# Patient Record
Sex: Female | Born: 1985 | Hispanic: No | Marital: Married | Smoking: Never smoker
Health system: Southern US, Community
[De-identification: ages and names within clinical notes are randomized; demographics above are authoritative.]

## PROBLEM LIST (undated history)

## (undated) DIAGNOSIS — O24419 Gestational diabetes mellitus in pregnancy, unspecified control: Secondary | ICD-10-CM

## (undated) HISTORY — PX: LAPAROSCOPY: SHX197

## (undated) HISTORY — DX: Gestational diabetes mellitus in pregnancy, unspecified control: O24.419

## (undated) HISTORY — PX: MOUTH SURGERY: SHX715

---

## 2014-02-10 NOTE — L&D Delivery Note (Cosign Needed)
Delivery Note At 8:29 PM a viable female was delivered via Vaginal, Spontaneous Delivery (Presentation: ; Occiput Anterior).  APGAR: 8, 9; weight 7 lb 4.6 oz (3305 g).  Infant dried and placed on pt's abd.  Placenta status: Intact, Spontaneous.  Cord: 3 vessels   Anesthesia: Epidural  Episiotomy: None Lacerations: 2nd degree;Perineal Suture Repair: 3.0 vicryl Est. Blood Loss (mL): 400  Mom to postpartum.  Baby to Couplet care / Skin to Skin.   Cam Hai CNM 10/11/2014, 9:33 PM

## 2014-08-23 ENCOUNTER — Ambulatory Visit (INDEPENDENT_AMBULATORY_CARE_PROVIDER_SITE_OTHER): Payer: Self-pay | Admitting: Advanced Practice Midwife

## 2014-08-23 ENCOUNTER — Encounter: Payer: Self-pay | Admitting: Advanced Practice Midwife

## 2014-08-23 VITALS — BP 119/78 | HR 85 | Temp 98.4°F | Ht 63.78 in | Wt 175.6 lb

## 2014-08-23 DIAGNOSIS — Z3483 Encounter for supervision of other normal pregnancy, third trimester: Secondary | ICD-10-CM

## 2014-08-23 DIAGNOSIS — Z118 Encounter for screening for other infectious and parasitic diseases: Secondary | ICD-10-CM

## 2014-08-23 DIAGNOSIS — Z113 Encounter for screening for infections with a predominantly sexual mode of transmission: Secondary | ICD-10-CM

## 2014-08-23 DIAGNOSIS — Z23 Encounter for immunization: Secondary | ICD-10-CM

## 2014-08-23 DIAGNOSIS — O0933 Supervision of pregnancy with insufficient antenatal care, third trimester: Secondary | ICD-10-CM | POA: Insufficient documentation

## 2014-08-23 DIAGNOSIS — O0990 Supervision of high risk pregnancy, unspecified, unspecified trimester: Secondary | ICD-10-CM | POA: Insufficient documentation

## 2014-08-23 DIAGNOSIS — Z3493 Encounter for supervision of normal pregnancy, unspecified, third trimester: Secondary | ICD-10-CM

## 2014-08-23 DIAGNOSIS — Z3201 Encounter for pregnancy test, result positive: Secondary | ICD-10-CM

## 2014-08-23 DIAGNOSIS — Z124 Encounter for screening for malignant neoplasm of cervix: Secondary | ICD-10-CM

## 2014-08-23 LAB — POCT URINALYSIS DIP (DEVICE)
Bilirubin Urine: NEGATIVE
Glucose, UA: NEGATIVE mg/dL
HGB URINE DIPSTICK: NEGATIVE
KETONES UR: NEGATIVE mg/dL
Leukocytes, UA: NEGATIVE
Nitrite: NEGATIVE
PH: 6.5 (ref 5.0–8.0)
Protein, ur: NEGATIVE mg/dL
Specific Gravity, Urine: 1.01 (ref 1.005–1.030)
UROBILINOGEN UA: 0.2 mg/dL (ref 0.0–1.0)

## 2014-08-23 LAB — POCT PREGNANCY, URINE: Preg Test, Ur: POSITIVE — AB

## 2014-08-23 MED ORDER — ASPIRIN 81 MG PO TABS: 81.0000 mg | ORAL_TABLET | Freq: Every day | ORAL | Status: DC

## 2014-08-23 MED ORDER — TETANUS-DIPHTH-ACELL PERTUSSIS 5-2.5-18.5 LF-MCG/0.5 IM SUSP
0.5000 mL | Freq: Once | INTRAMUSCULAR | Status: AC
  Administered 2014-08-23: 0.5 mL via INTRAMUSCULAR

## 2014-08-23 MED ORDER — CONCEPT OB 130-92.4-1 MG PO CAPS: 1.0000 | ORAL_CAPSULE | Freq: Every day | ORAL | Status: DC

## 2014-08-23 NOTE — Progress Notes (Signed)
Reports pain in both legs similar to when on period  Discussed with patient that this is a teaching facility and that during their pregnancy there may be female physicians and other healthcare providers involved in their care. This includes, but is not limited to, prenatal visits and ultrasound examinations, as well as, the labor and delivery process and postpartum care. Also discussed with patient that they do have the right to transfer their care to another practice in the event that they do not agree or wish to see female providers under any situation. Informed patient that we will make every attempt to have a female provider care for them, though this cannot be guaranteed, such as in an emergent situation. Also, reminded patient that when they are scheduling their appointments, to request a female provider each time and we will try to accommodate their request.

## 2014-08-23 NOTE — Patient Instructions (Addendum)
Monilial Vaginitis  You can take Monistat 7 from the pharmacy  Vaginitis in a soreness, swelling and redness (inflammation) of the vagina and vulva. Monilial vaginitis is not a sexually transmitted infection. CAUSES  Yeast vaginitis is caused by yeast (candida) that is normally found in your vagina. With a yeast infection, the candida has overgrown in number to a point that upsets the chemical balance. SYMPTOMS   White, thick vaginal discharge.  Swelling, itching, redness and irritation of the vagina and possibly the lips of the vagina (vulva).  Burning or painful urination.  Painful intercourse. DIAGNOSIS  Things that may contribute to monilial vaginitis are:  Postmenopausal and virginal states.  Pregnancy.  Infections.  Being tired, sick or stressed, especially if you had monilial vaginitis in the past.  Diabetes. Good control will help lower the chance.  Birth control pills.  Tight fitting garments.  Using bubble bath, feminine sprays, douches or deodorant tampons.  Taking certain medications that kill germs (antibiotics).  Sporadic recurrence can occur if you become ill. TREATMENT  Your caregiver will give you medication.  There are several kinds of anti monilial vaginal creams and suppositories specific for monilial vaginitis. For recurrent yeast infections, use a suppository or cream in the vagina 2 times a week, or as directed.  Anti-monilial or steroid cream for the itching or irritation of the vulva may also be used. Get your caregiver's permission.  Painting the vagina with methylene blue solution may help if the monilial cream does not work.  Eating yogurt may help prevent monilial vaginitis. HOME CARE INSTRUCTIONS   Finish all medication as prescribed.  Do not have sex until treatment is completed or after your caregiver tells you it is okay.  Take warm sitz baths.  Do not douche.  Do not use tampons, especially scented ones.  Wear cotton  underwear.  Avoid tight pants and panty hose.  Tell your sexual partner that you have a yeast infection. They should go to their caregiver if they have symptoms such as mild rash or itching.  Your sexual partner should be treated as well if your infection is difficult to eliminate.  Practice safer sex. Use condoms.  Some vaginal medications cause latex condoms to fail. Vaginal medications that harm condoms are:  Cleocin cream.  Butoconazole (Femstat).  Terconazole (Terazol) vaginal suppository.  Miconazole (Monistat) (may be purchased over the counter). SEEK MEDICAL CARE IF:   You have a temperature by mouth above 102 F (38.9 C).  The infection is getting worse after 2 days of treatment.  The infection is not getting better after 3 days of treatment.  You develop blisters in or around your vagina.  You develop vaginal bleeding, and it is not your menstrual period.  You have pain when you urinate.  You develop intestinal problems.  You have pain with sexual intercourse. Document Released: 11/06/2004 Document Revised: 04/21/2011 Document Reviewed: 07/21/2008 [facility] Patient Information 2015 [place], [place]. This information is not intended to replace advice given to you by your health care provider. Make sure you discuss any questions you have with your health care provider.   Breastfeeding Deciding to breastfeed is one of the best choices you can make for you and your baby. A change in hormones during pregnancy causes your breast tissue to grow and increases the number and size of your milk ducts. These hormones also allow proteins, sugars, and fats from your blood supply to make breast milk in your milk-producing glands. Hormones prevent breast milk from being released  before your baby is born as well as prompt milk flow after birth. Once breastfeeding has begun, thoughts of your baby, as well as his or her sucking or crying, can stimulate the release of milk from your  milk-producing glands.  BENEFITS OF BREASTFEEDING For Your Baby  Your first milk (colostrum) helps your baby's digestive system function better.   There are antibodies in your milk that help your baby fight off infections.   Your baby has a lower incidence of asthma, allergies, and sudden infant death syndrome.   The nutrients in breast milk are better for your baby than infant formulas and are designed uniquely for your baby's needs.   Breast milk improves your baby's brain development.   Your baby is less likely to develop other conditions, such as childhood obesity, asthma, or type 2 diabetes mellitus.  For You   Breastfeeding helps to create a very special bond between you and your baby.   Breastfeeding is convenient. Breast milk is always available at the correct temperature and costs nothing.   Breastfeeding helps to burn calories and helps you lose the weight gained during pregnancy.   Breastfeeding makes your uterus contract to its prepregnancy size faster and slows bleeding (lochia) after you give birth.   Breastfeeding helps to lower your risk of developing type 2 diabetes mellitus, osteoporosis, and breast or ovarian cancer later in life. SIGNS THAT YOUR BABY IS HUNGRY Early Signs of Hunger  Increased alertness or activity.  Stretching.  Movement of the head from side to side.  Movement of the head and opening of the mouth when the corner of the mouth or cheek is stroked (rooting).  Increased sucking sounds, smacking lips, cooing, sighing, or squeaking.  Hand-to-mouth movements.  Increased sucking of fingers or hands. Late Signs of Hunger  Fussing.  Intermittent crying. Extreme Signs of Hunger Signs of extreme hunger will require calming and consoling before your baby will be able to breastfeed successfully. Do not wait for the following signs of extreme hunger to occur before you initiate breastfeeding:   Restlessness.  A loud, strong  cry.   Screaming. BREASTFEEDING BASICS Breastfeeding Initiation  Find a comfortable place to sit or lie down, with your neck and back well supported.  Place a pillow or rolled up blanket under your baby to bring him or her to the level of your breast (if you are seated). Nursing pillows are specially designed to help support your arms and your baby while you breastfeed.  Make sure that your baby's abdomen is facing your abdomen.   Gently massage your breast. With your fingertips, massage from your chest wall toward your nipple in a circular motion. This encourages milk flow. You may need to continue this action during the feeding if your milk flows slowly.  Support your breast with 4 fingers underneath and your thumb above your nipple. Make sure your fingers are well away from your nipple and your baby's mouth.   Stroke your baby's lips gently with your finger or nipple.   When your baby's mouth is open wide enough, quickly bring your baby to your breast, placing your entire nipple and as much of the colored area around your nipple (areola) as possible into your baby's mouth.   More areola should be visible above your baby's upper lip than below the lower lip.   Your baby's tongue should be between his or her lower gum and your breast.   Ensure that your baby's mouth is correctly positioned around  your nipple (latched). Your baby's lips should create a seal on your breast and be turned out (everted).  It is common for your baby to suck about 2-3 minutes in order to start the flow of breast milk. Latching Teaching your baby how to latch on to your breast properly is very important. An improper latch can cause nipple pain and decreased milk supply for you and poor weight gain in your baby. Also, if your baby is not latched onto your nipple properly, he or she may swallow some air during feeding. This can make your baby fussy. Burping your baby when you switch breasts during the  feeding can help to get rid of the air. However, teaching your baby to latch on properly is still the best way to prevent fussiness from swallowing air while breastfeeding. Signs that your baby has successfully latched on to your nipple:    Silent tugging or silent sucking, without causing you pain.   Swallowing heard between every 3-4 sucks.    Muscle movement above and in front of his or her ears while sucking.  Signs that your baby has not successfully latched on to nipple:   Sucking sounds or smacking sounds from your baby while breastfeeding.  Nipple pain. If you think your baby has not latched on correctly, slip your finger into the corner of your baby's mouth to break the suction and place it between your baby's gums. Attempt breastfeeding initiation again. Signs of Successful Breastfeeding Signs from your baby:   A gradual decrease in the number of sucks or complete cessation of sucking.   Falling asleep.   Relaxation of his or her body.   Retention of a small amount of milk in his or her mouth.   Letting go of your breast by himself or herself. Signs from you:  Breasts that have increased in firmness, weight, and size 1-3 hours after feeding.   Breasts that are softer immediately after breastfeeding.  Increased milk volume, as well as a change in milk consistency and color by the fifth day of breastfeeding.   Nipples that are not sore, cracked, or bleeding. Signs That Your Pecola Leisure is Getting Enough Milk  Wetting at least 3 diapers in a 24-hour period. The urine should be clear and pale yellow by age 264 days.  At least 3 stools in a 24-hour period by age 264 days. The stool should be soft and yellow.  At least 3 stools in a 24-hour period by age 37 days. The stool should be seedy and yellow.  No loss of weight greater than 10% of birth weight during the first 78 days of age.  Average weight gain of 4-7 ounces (113-198 g) per week after age 792 days.  Consistent  daily weight gain by age 264 days, without weight loss after the age of 2 weeks. After a feeding, your baby may spit up a small amount. This is common. BREASTFEEDING FREQUENCY AND DURATION Frequent feeding will help you make more milk and can prevent sore nipples and breast engorgement. Breastfeed when you feel the need to reduce the fullness of your breasts or when your baby shows signs of hunger. This is called "breastfeeding on demand." Avoid introducing a pacifier to your baby while you are working to establish breastfeeding (the first 4-6 weeks after your baby is born). After this time you may choose to use a pacifier. Research has shown that pacifier use during the first year of a baby's life decreases the risk of sudden  infant death syndrome (SIDS). Allow your baby to feed on each breast as long as he or she wants. Breastfeed until your baby is finished feeding. When your baby unlatches or falls asleep while feeding from the first breast, offer the second breast. Because newborns are often sleepy in the first few weeks of life, you may need to awaken your baby to get him or her to feed. Breastfeeding times will vary from baby to baby. However, the following rules can serve as a guide to help you ensure that your baby is properly fed:  Newborns (babies 63 weeks of age or younger) may breastfeed every 1-3 hours.  Newborns should not go longer than 3 hours during the day or 5 hours during the night without breastfeeding.  You should breastfeed your baby a minimum of 8 times in a 24-hour period until you begin to introduce solid foods to your baby at around 67 months of age. BREAST MILK PUMPING Pumping and storing breast milk allows you to ensure that your baby is exclusively fed your breast milk, even at times when you are unable to breastfeed. This is especially important if you are going back to work while you are still breastfeeding or when you are not able to be present during feedings. Your  lactation consultant can give you guidelines on how long it is safe to store breast milk.  A breast pump is a machine that allows you to pump milk from your breast into a sterile bottle. The pumped breast milk can then be stored in a refrigerator or freezer. Some breast pumps are operated by hand, while others use electricity. Ask your lactation consultant which type will work best for you. Breast pumps can be purchased, but some hospitals and breastfeeding support groups lease breast pumps on a monthly basis. A lactation consultant can teach you how to hand express breast milk, if you prefer not to use a pump.  CARING FOR YOUR BREASTS WHILE YOU BREASTFEED Nipples can become dry, cracked, and sore while breastfeeding. The following recommendations can help keep your breasts moisturized and healthy:  Avoid using soap on your nipples.   Wear a supportive bra. Although not required, special nursing bras and tank tops are designed to allow access to your breasts for breastfeeding without taking off your entire bra or top. Avoid wearing underwire-style bras or extremely tight bras.  Air dry your nipples for 3-23minutes after each feeding.   Use only cotton bra pads to absorb leaked breast milk. Leaking of breast milk between feedings is normal.   Use lanolin on your nipples after breastfeeding. Lanolin helps to maintain your skin's normal moisture barrier. If you use pure lanolin, you do not need to wash it off before feeding your baby again. Pure lanolin is not toxic to your baby. You may also hand express a few drops of breast milk and gently massage that milk into your nipples and allow the milk to air dry. In the first few weeks after giving birth, some women experience extremely full breasts (engorgement). Engorgement can make your breasts feel heavy, warm, and tender to the touch. Engorgement peaks within 3-5 days after you give birth. The following recommendations can help ease  engorgement:  Completely empty your breasts while breastfeeding or pumping. You may want to start by applying warm, moist heat (in the shower or with warm water-soaked hand towels) just before feeding or pumping. This increases circulation and helps the milk flow. If your baby does not completely empty your  breasts while breastfeeding, pump any extra milk after he or she is finished.  Wear a snug bra (nursing or regular) or tank top for 1-2 days to signal your body to slightly decrease milk production.  Apply ice packs to your breasts, unless this is too uncomfortable for you.  Make sure that your baby is latched on and positioned properly while breastfeeding. If engorgement persists after 48 hours of following these recommendations, contact your health care provider or a Advertising copywriter. OVERALL HEALTH CARE RECOMMENDATIONS WHILE BREASTFEEDING  Eat healthy foods. Alternate between meals and snacks, eating 3 of each per day. Because what you eat affects your breast milk, some of the foods may make your baby more irritable than usual. Avoid eating these foods if you are sure that they are negatively affecting your baby.  Drink milk, fruit juice, and water to satisfy your thirst (about 10 glasses a day).   Rest often, relax, and continue to take your prenatal vitamins to prevent fatigue, stress, and anemia.  Continue breast self-awareness checks.  Avoid chewing and smoking tobacco.  Avoid alcohol and drug use. Some medicines that may be harmful to your baby can pass through breast milk. It is important to ask your health care provider before taking any medicine, including all over-the-counter and prescription medicine as well as vitamin and herbal supplements. It is possible to become pregnant while breastfeeding. If birth control is desired, ask your health care provider about options that will be safe for your baby. SEEK MEDICAL CARE IF:   You feel like you want to stop breastfeeding  or have become frustrated with breastfeeding.  You have painful breasts or nipples.  Your nipples are cracked or bleeding.  Your breasts are red, tender, or warm.  You have a swollen area on either breast.  You have a fever or chills.  You have nausea or vomiting.  You have drainage other than breast milk from your nipples.  Your breasts do not become full before feedings by the fifth day after you give birth.  You feel sad and depressed.  Your baby is too sleepy to eat well.  Your baby is having trouble sleeping.   Your baby is wetting less than 3 diapers in a 24-hour period.  Your baby has less than 3 stools in a 24-hour period.  Your baby's skin or the white part of his or her eyes becomes yellow.   Your baby is not gaining weight by 52 days of age. SEEK IMMEDIATE MEDICAL CARE IF:   Your baby is overly tired (lethargic) and does not want to wake up and feed.  Your baby develops an unexplained fever. Document Released: 01/27/2005 Document Revised: 02/01/2013 Document Reviewed: 07/21/2012 [facility] Patient Information 2015 [place], [place]. This information is not intended to replace advice given to you by your health care provider. Make sure you discuss any questions you have with your health care provider.  Preterm Labor Information Preterm labor is when labor starts at less than 37 weeks of pregnancy. The normal length of a pregnancy is 39 to 41 weeks. CAUSES Often, there is no identifiable underlying cause as to why a woman goes into preterm labor. One of the most common known causes of preterm labor is infection. Infections of the uterus, cervix, vagina, amniotic sac, bladder, kidney, or even the lungs (pneumonia) can cause labor to start. Other suspected causes of preterm labor include:   Urogenital infections, such as yeast infections and bacterial vaginosis.   Uterine abnormalities (uterine shape,  uterine septum, fibroids, or bleeding from the placenta).   A  cervix that has been operated on (it may fail to stay closed).   Malformations in the fetus.   Multiple gestations (twins, triplets, and so on).   Breakage of the amniotic sac.  RISK FACTORS  Having a previous history of preterm labor.   Having premature rupture of membranes (PROM).   Having a placenta that covers the opening of the cervix (placenta previa).   Having a placenta that separates from the uterus (placental abruption).   Having a cervix that is too weak to hold the fetus in the uterus (incompetent cervix).   Having too much fluid in the amniotic sac (polyhydramnios).   Taking illegal drugs or smoking while pregnant.   Not gaining enough weight while pregnant.   Being younger than 84 and older than 27 years old.   Having a low socioeconomic status.   Being African American. SYMPTOMS Signs and symptoms of preterm labor include:   Menstrual-like cramps, abdominal pain, or back pain.  Uterine contractions that are regular, as frequent as six in an hour, regardless of their intensity (may be mild or painful).  Contractions that start on the top of the uterus and spread down to the lower abdomen and back.   A sense of increased pelvic pressure.   A watery or bloody mucus discharge that comes from the vagina.  TREATMENT Depending on the length of the pregnancy and other circumstances, your health care provider may suggest bed rest. If necessary, there are medicines that can be given to stop contractions and to mature the fetal lungs. If labor happens before 34 weeks of pregnancy, a prolonged hospital stay may be recommended. Treatment depends on the condition of both you and the fetus.  WHAT SHOULD YOU DO IF YOU THINK YOU ARE IN PRETERM LABOR? Call your health care provider right away. You will need to go to the hospital to get checked immediately. HOW CAN YOU PREVENT PRETERM LABOR IN FUTURE PREGNANCIES? You should:   Stop smoking if you  smoke.  Maintain healthy weight gain and avoid chemicals and drugs that are not necessary.  Be watchful for any type of infection.  Inform your health care provider if you have a known history of preterm labor. Document Released: 04/19/2003 Document Revised: 09/29/2012 Document Reviewed: 03/01/2012 [facility] Patient Information 2015 [place], [place]. This information is not intended to replace advice given to you by your health care provider. Make sure you discuss any questions you have with your health care provider.

## 2014-08-23 NOTE — Progress Notes (Signed)
Subjective:    Brandy Burnett is a G2P0010 [redacted]w[redacted]d by LMP being seen today for her first obstetrical visit. Received prenatal care before moving to [place]. Her obstetrical history is significant for Nulliparity.  Patient does intend to breast feed. Pregnancy history fully reviewed. Records not available from previous provider and pt cannot get them, but she and husband are excellent historians. Normal pregnancy. Dated by LMP/early [place]. Normal anatomy scan two weeks ago. Does not think she had Pap.  Patient reports backache, vulvar burning.  BP 119/78 mmHg  Pulse 85  Temp(Src) 98.4 F (36.9 C)  Ht 5' 3.78" (1.62 m)  Wt 175 lb 9.6 oz (79.652 kg)  BMI 30.35 kg/m2  LMP 01/10/2014 (LMP Unknown)  HISTORY: OB History  Gravida Para Term Preterm AB SAB TAB Ectopic Multiple Living     # Outcome Date GA Lbr Len/2nd Weight Sex Delivery Anes PTL Lv  2 Current           1 SAB              History reviewed. No pertinent past medical history. Past Surgical History  Procedure Laterality Date  . Laparoscopy      bladder   Family History  Problem Relation Age of Onset  . Cancer Father   . Hypertension Father      Exam    Uterus:   Fundal height 33 cm  Pelvic Exam:    Perineum: No Hemorrhoids, Normal Perineum   Vulva: normal, Bartholin's, Urethra, Skene's normal   Vagina:  curdlike discharge, vaginal mucosa erythematous     pH: NA   Cervix: no bleeding following Pap, no cervical motion tenderness and nulliparous appearance   Adnexa: normal adnexa and no mass, fullness, tenderness   Bony Pelvis: average  System: Breast:  deferred   Skin: normal coloration and turgor, no rashes    Neurologic: oriented, normal, gait normal; reflexes normal and symmetric, grossly non-focal   Extremities: normal strength, tone, and muscle mass, edema   HEENT sclera clear, anicteric, neck supple with midline trachea and thyroid without masses   Mouth/Teeth mucous membranes moist, pharynx  normal without lesions and dental hygiene good   Neck supple and no masses   Cardiovascular: regular rate and rhythm, no murmurs or gallops   Respiratory:  appears well, vitals normal, no respiratory distress, acyanotic, normal RR, chest clear, no wheezing, crepitations, rhonchi, normal symmetric air entry   Abdomen: soft, non-tender; bowel sounds normal; no masses,  no organomegaly   Urinary: urethral meatus normal      Assessment:    Pregnancy: G2P0010 Patient Active Problem List   Diagnosis Date Noted  . Supervision of normal pregnancy in third trimester 08/23/2014  . Limited prenatal care in third trimester 08/23/2014  1. Late prenatal care affecting pregnancy in third trimester  - Prenatal Profile - Culture, OB Urine - Prescript Monitor Profile(19) - Cytology - PAP - US OB Comp + 14 Wk; Future - Tdap (BOOSTRIX) injection 0.5 mL; Inject 0.5 mLs into the muscle once. - Glucose Tolerance, 1 HR (50g) w/o Fasting - Prenat w/o A Vit-FeFum-FePo-FA (CONCEPT OB) 130-92.4-1 MG CAPS; Take 1 tablet by mouth daily. (Patient not taking: Reported on 08/29/2014)  Dispense: 30 capsule; Refill: 12  2. Supervision of normal pregnancy in third trimester  - Glucose Tolerance, 1 HR (50g) w/o Fasting - Prenat w/o A Vit-FeFum-FePo-FA (CONCEPT OB) 130-92.4-1 MG CAPS; Take 1 tablet by mouth daily. (Patient  not taking: Reported on 08/29/2014)  Dispense: 30 capsule; Refill: 12 - aspirin 81 MG tablet; Take 1 tablet (81 mg total) by mouth daily.  Dispense: 30 tablet; Refill: 3  3. Limited prenatal care in third trimester  - Glucose Tolerance, 1 HR (50g) w/o Fasting   Plan:   Initial labs, GTT drawn. Prenatal vitamins. Problem list reviewed and updated. Genetic Screening discussed Quad Screen: too late. Ultrasound discussed; fetal survey: Normal per pt. Declined for now. Discussed that US may be needed if medical indication arises.  Follow up in 2 weeks.  Dorathy KinsmanSMITH, [place]ah Reyburn 08/23/2014

## 2014-08-24 LAB — PRENATAL PROFILE (SOLSTAS)
ANTIBODY SCREEN: NEGATIVE
Basophils Absolute: 0 10*3/uL (ref 0.0–0.1)
Basophils Relative: 0 % (ref 0–1)
Eosinophils Absolute: 0.2 10*3/uL (ref 0.0–0.7)
Eosinophils Relative: 2 % (ref 0–5)
HCT: 33.4 % — ABNORMAL LOW (ref 36.0–46.0)
HIV: NONREACTIVE
Hemoglobin: 11 g/dL — ABNORMAL LOW (ref 12.0–15.0)
Hepatitis B Surface Ag: NEGATIVE
Lymphocytes Relative: 18 % (ref 12–46)
Lymphs Abs: 1.7 10*3/uL (ref 0.7–4.0)
MCH: 26.7 pg (ref 26.0–34.0)
MCHC: 32.9 g/dL (ref 30.0–36.0)
MCV: 81.1 fL (ref 78.0–100.0)
MONO ABS: 0.5 10*3/uL (ref 0.1–1.0)
MONOS PCT: 5 % (ref 3–12)
MPV: 9.5 fL (ref 8.6–12.4)
NEUTROS PCT: 75 % (ref 43–77)
Neutro Abs: 7.3 10*3/uL (ref 1.7–7.7)
PLATELETS: 240 10*3/uL (ref 150–400)
RBC: 4.12 MIL/uL (ref 3.87–5.11)
RDW: 17.4 % — ABNORMAL HIGH (ref 11.5–15.5)
RH TYPE: POSITIVE
RUBELLA: 10.8 {index} — AB (ref <0.90)
WBC: 9.7 10*3/uL (ref 4.0–10.5)

## 2014-08-24 LAB — GLUCOSE TOLERANCE, 1 HOUR (50G) W/O FASTING: Glucose, 1 Hour GTT: 168 mg/dL — ABNORMAL HIGH (ref 70–140)

## 2014-08-24 LAB — CULTURE, OB URINE
Colony Count: NO GROWTH
Organism ID, Bacteria: NO GROWTH

## 2014-08-25 LAB — PRESCRIPTION MONITORING PROFILE (19 PANEL)
Amphetamine/Meth: NEGATIVE ng/mL
BUPRENORPHINE, URINE: NEGATIVE ng/mL
Barbiturate Screen, Urine: NEGATIVE ng/mL
Benzodiazepine Screen, Urine: NEGATIVE ng/mL
CANNABINOID SCRN UR: NEGATIVE ng/mL
CARISOPRODOL, URINE: NEGATIVE ng/mL
COCAINE METABOLITES: NEGATIVE ng/mL
Creatinine, Urine: 40.77 mg/dL (ref >20.0)
Fentanyl, Ur: NEGATIVE ng/mL
MDMA URINE: NEGATIVE ng/mL
Meperidine, Ur: NEGATIVE ng/mL
Methadone Screen, Urine: NEGATIVE ng/mL
Methaqualone: NEGATIVE ng/mL
Nitrites, Initial: NEGATIVE ug/mL
Opiate Screen, Urine: NEGATIVE ng/mL
Oxycodone Screen, Ur: NEGATIVE ng/mL
PH URINE, INITIAL: 6.5 pH (ref 4.5–8.9)
PHENCYCLIDINE, UR: NEGATIVE ng/mL
PROPOXYPHENE: NEGATIVE ng/mL
TRAMADOL UR: NEGATIVE ng/mL
Tapentadol, urine: NEGATIVE ng/mL
ZOLPIDEM, URINE: NEGATIVE ng/mL

## 2014-08-25 LAB — CYTOLOGY - PAP

## 2014-08-29 ENCOUNTER — Ambulatory Visit (INDEPENDENT_AMBULATORY_CARE_PROVIDER_SITE_OTHER): Payer: Self-pay | Admitting: Family Medicine

## 2014-08-29 VITALS — BP 118/75 | HR 96 | Wt 177.7 lb

## 2014-08-29 DIAGNOSIS — R7302 Impaired glucose tolerance (oral): Secondary | ICD-10-CM

## 2014-08-29 DIAGNOSIS — O0993 Supervision of high risk pregnancy, unspecified, third trimester: Secondary | ICD-10-CM

## 2014-08-29 DIAGNOSIS — O0933 Supervision of pregnancy with insufficient antenatal care, third trimester: Secondary | ICD-10-CM

## 2014-08-29 DIAGNOSIS — R7309 Other abnormal glucose: Secondary | ICD-10-CM | POA: Insufficient documentation

## 2014-08-29 LAB — POCT URINALYSIS DIP (DEVICE)
BILIRUBIN URINE: NEGATIVE
GLUCOSE, UA: 250 mg/dL — AB
Hgb urine dipstick: NEGATIVE
Ketones, ur: NEGATIVE mg/dL
Leukocytes, UA: NEGATIVE
Nitrite: NEGATIVE
PH: 5.5 (ref 5.0–8.0)
PROTEIN: NEGATIVE mg/dL
SPECIFIC GRAVITY, URINE: 1.025 (ref 1.005–1.030)
Urobilinogen, UA: 0.2 mg/dL (ref 0.0–1.0)

## 2014-08-29 NOTE — Patient Instructions (Signed)
Gestational Diabetes Mellitus Gestational diabetes mellitus, often simply referred to as gestational diabetes, is a type of diabetes that some women develop during pregnancy. In gestational diabetes, the pancreas does not make enough insulin (a hormone), the cells are less responsive to the insulin that is made (insulin resistance), or both.Normally, insulin moves sugars from food into the tissue cells. The tissue cells use the sugars for energy. The lack of insulin or the lack of normal response to insulin causes excess sugars to build up in the blood instead of going into the tissue cells. As a result, high blood sugar (hyperglycemia) develops. The effect of high sugar (glucose) levels can cause many problems.  RISK FACTORS You have an increased chance of developing gestational diabetes if you have a family history of diabetes and also have one or more of the following risk factors:  A body mass index over 30 (obesity).  A previous pregnancy with gestational diabetes.  An older age at the time of pregnancy. If blood glucose levels are kept in the normal range during pregnancy, women can have a healthy pregnancy. If your blood glucose levels are not well controlled, there may be risks to you, your unborn baby (fetus), your labor and delivery, or your newborn baby.  SYMPTOMS  If symptoms are experienced, they are much like symptoms you would normally expect during pregnancy. The symptoms of gestational diabetes include:   Increased thirst (polydipsia).  Increased urination (polyuria).  Increased urination during the night (nocturia).  Weight loss. This weight loss may be rapid.  Frequent, recurring infections.  Tiredness (fatigue).  Weakness.  Vision changes, such as blurred vision.  Fruity smell to your breath.  Abdominal pain. DIAGNOSIS Diabetes is diagnosed when blood glucose levels are increased. Your blood glucose level may be checked by one or more of the following blood  tests:  A fasting blood glucose test. You will not be allowed to eat for at least 8 hours before a blood sample is taken.  A random blood glucose test. Your blood glucose is checked at any time of the day regardless of when you ate.  A hemoglobin A1c blood glucose test. A hemoglobin A1c test provides information about blood glucose control over the previous 3 months.  An oral glucose tolerance test (OGTT). Your blood glucose is measured after you have not eaten (fasted) for 1-3 hours and then after you drink a glucose-containing beverage. Since the hormones that cause insulin resistance are highest at about 24-28 weeks of a pregnancy, an OGTT is usually performed during that time. If you have risk factors for gestational diabetes, your health care provider may test you for gestational diabetes earlier than 24 weeks of pregnancy. TREATMENT   You will need to take diabetes medicine or insulin daily to keep blood glucose levels in the desired range.  You will need to match insulin dosing with exercise and healthy food choices. The treatment goal is to maintain the before-meal (preprandial), bedtime, and overnight blood glucose level at 60-99 mg/dL during pregnancy. The treatment goal is to further maintain peak after-meal blood sugar (postprandial glucose) level at 100-140 mg/dL. HOME CARE INSTRUCTIONS   Have your hemoglobin A1c level checked twice a year.  Perform daily blood glucose monitoring as directed by your health care provider. It is common to perform frequent blood glucose monitoring.  Monitor urine ketones when you are ill and as directed by your health care provider.  Take your diabetes medicine and insulin as directed by your health care provider   to maintain your blood glucose level in the desired range.  Never run out of diabetes medicine or insulin. It is needed every day.  Adjust insulin based on your intake of carbohydrates. Carbohydrates can raise blood glucose levels but  need to be included in your diet. Carbohydrates provide vitamins, minerals, and fiber which are an essential part of a healthy diet. Carbohydrates are found in fruits, vegetables, whole grains, dairy products, legumes, and foods containing added sugars.  Eat healthy foods. Alternate 3 meals with 3 snacks.  Maintain a healthy weight gain. The usual total expected weight gain varies according to your prepregnancy body mass index (BMI).  Carry a medical alert card or wear your medical alert jewelry.  Carry a 15-gram carbohydrate snack with you at all times to treat low blood glucose (hypoglycemia). Some examples of 15-gram carbohydrate snacks include:  Glucose tablets, 3 or 4.  Glucose gel, 15-gram tube.  Raisins, 2 tablespoons (24 g).  Jelly beans, 6.  Animal crackers, 8.  Fruit juice, regular soda, or low-fat milk, 4 ounces (120 mL).  Gummy treats, 9.  Recognize hypoglycemia. Hypoglycemia during pregnancy occurs with blood glucose levels of 60 mg/dL and below. The risk for hypoglycemia increases when fasting or skipping meals, during or after intense exercise, and during sleep. Hypoglycemia symptoms can include:  Tremors or shakes.  Decreased ability to concentrate.  Sweating.  Increased heart rate.  Headache.  Dry mouth.  Hunger.  Irritability.  Anxiety.  Restless sleep.  Altered speech or coordination.  Confusion.  Treat hypoglycemia promptly. If you are alert and able to safely swallow, follow the 15:15 rule:  Take 15-20 grams of rapid-acting glucose or carbohydrate. Rapid-acting options include glucose gel, glucose tablets, or 4 ounces (120 mL) of fruit juice, regular soda, or low-fat milk.  Check your blood glucose level 15 minutes after taking the glucose.  Take 15-20 grams more of glucose if the repeat blood glucose level is still 70 mg/dL or below.  Eat a meal or snack within 1 hour once blood glucose levels return to normal.  Be alert to polyuria  (excess urination) and polydipsia (excess thirst) which are early signs of hyperglycemia. An early awareness of hyperglycemia allows for prompt treatment. Treat hyperglycemia as directed by your health care provider.  Engage in at least 30 minutes of physical activity a day or as directed by your health care provider. Ten minutes of physical activity timed 30 minutes after each meal is encouraged to control postprandial blood glucose levels.  Adjust your insulin dosing and food intake as needed if you start a new exercise or sport.  Follow your sick-day plan at any time you are unable to eat or drink as usual.  Avoid tobacco and alcohol use.  Keep all follow-up visits as directed by your health care provider.  Follow the advice of your health care provider regarding your prenatal and post-delivery (postpartum) appointments, meal planning, exercise, medicines, vitamins, blood tests, other medical tests, and physical activities.  Perform daily skin and foot care. Examine your skin and feet daily for cuts, bruises, redness, nail problems, bleeding, blisters, or sores.  Brush your teeth and gums at least twice a day and floss at least once a day. Follow up with your dentist regularly.  Schedule an eye exam during the first trimester of your pregnancy or as directed by your health care provider.  Share your diabetes management plan with your workplace or school.  Stay up-to-date with immunizations.  Learn to manage stress.    Obtain ongoing diabetes education and support as needed.  Learn about and consider breastfeeding your baby.  You should have your blood sugar level checked 6-12 weeks after delivery. This is done with an oral glucose tolerance test (OGTT). SEEK MEDICAL CARE IF:   You are unable to eat food or drink fluids for more than 6 hours.  You have nausea and vomiting for more than 6 hours.  You have a blood glucose level of 200 mg/dL and you have ketones in your  urine.  There is a change in mental status.  You develop vision problems.  You have a persistent headache.  You have upper abdominal pain or discomfort.  You develop an additional serious illness.  You have diarrhea for more than 6 hours.  You have been sick or have had a fever for a couple of days and are not getting better. SEEK IMMEDIATE MEDICAL CARE IF:   You have difficulty breathing.  You no longer feel the baby moving.  You are bleeding or have discharge from your vagina.  You start having premature contractions or labor. MAKE SURE YOU:  Understand these instructions.  Will watch your condition.  Will get help right away if you are not doing well or get worse. Document Released: 05/05/2000 Document Revised: 06/13/2013 Document Reviewed: 08/26/2011 ExitCare Patient Information 2015 [employer]. This information is not intended to replace advice given to you by your health care provider. Make sure you discuss any questions you have with your health care provider.  

## 2014-08-29 NOTE — Progress Notes (Signed)
Subjective:  Brandy Burnett is a 29 y.o. G2P0010 at 7015w0d being seen today for ongoing prenatal care.  Patient reports no complaints.  Contractions: Not present.  Vag. Bleeding: None. Movement: Present. Denies leaking of fluid.   Does not want to get US here because she had an ultrasound just prior to leaving.  Will obtain copy of US.  The following portions of the patient's history were reviewed and updated as appropriate: allergies, current medications, past family history, past medical history, past social history, past surgical history and problem list.   Objective:   Filed Vitals:   08/29/14 1448  BP: 118/75  Pulse: 96  Weight: 177 lb 11.2 oz (80.604 kg)    Fetal Status: Fetal Heart Rate (bpm): 150 Fundal Height: 32 cm Movement: Present     General:  Alert, oriented and cooperative. Patient is in no acute distress.  Skin: Skin is warm and dry. No rash noted.   Cardiovascular: Normal heart rate noted  Respiratory: Normal respiratory effort, no problems with respiration noted  Abdomen: Soft, gravid, appropriate for gestational age. Pain/Pressure: Present     Vaginal: Vag. Bleeding: None.       Cervix: Not evaluated        Extremities: Normal range of motion.  Edema: None  Mental Status: Normal mood and affect. Normal behavior. Normal judgment and thought content.   Urinalysis: Urine Protein: Negative Urine Glucose: 2+  Assessment and Plan:  Pregnancy: G2P0010 at 6515w0d  1. Supervision of high-risk pregnancy, third trimester FHT and fundal height normal  2. Elevated glucose tolerance test Schedule 3hr GTT.  3. Insufficient prenatal care in third trimester  Preterm labor symptoms and general obstetric precautions including but not limited to vaginal bleeding, contractions, leaking of fluid and fetal movement were reviewed in detail with the patient. Please refer to After Visit Summary for other counseling recommendations.  No Follow-up on file.   Levie HeritageJacob J Akire Rennert, DO

## 2014-08-31 ENCOUNTER — Encounter: Payer: Self-pay | Admitting: *Deleted

## 2014-08-31 ENCOUNTER — Ambulatory Visit (HOSPITAL_COMMUNITY): Payer: Self-pay | Attending: Advanced Practice Midwife

## 2014-09-01 ENCOUNTER — Other Ambulatory Visit: Payer: Self-pay

## 2014-09-01 DIAGNOSIS — R7309 Other abnormal glucose: Secondary | ICD-10-CM

## 2014-09-02 LAB — GLUCOSE TOLERANCE, 3 HOURS
GLUCOSE, 1 HOUR-GESTATIONAL: 280 mg/dL — AB (ref 70–189)
Glucose Tolerance, 2 hour: 228 mg/dL — ABNORMAL HIGH (ref 70–164)
Glucose Tolerance, Fasting: 103 mg/dL (ref 70–104)
Glucose, GTT - 3 Hour: 177 mg/dL — ABNORMAL HIGH (ref 70–144)

## 2014-09-03 ENCOUNTER — Encounter: Payer: Self-pay | Admitting: Obstetrics and Gynecology

## 2014-09-03 DIAGNOSIS — O24419 Gestational diabetes mellitus in pregnancy, unspecified control: Secondary | ICD-10-CM | POA: Insufficient documentation

## 2014-09-05 ENCOUNTER — Telehealth: Payer: Self-pay | Admitting: General Practice

## 2014-09-05 NOTE — Telephone Encounter (Signed)
Called patient and patient's husband and informed of 3 hr gtt results. Explained at her appt on Monday 8/1 she will see doctor as usual and the diabetes educator, who will explain everything including how to check her blood sugar and dietary recommendations. Patient and husband verbalizes understanding and has no questions

## 2014-09-11 ENCOUNTER — Ambulatory Visit (INDEPENDENT_AMBULATORY_CARE_PROVIDER_SITE_OTHER): Payer: Self-pay | Admitting: Obstetrics & Gynecology

## 2014-09-11 ENCOUNTER — Ambulatory Visit (HOSPITAL_COMMUNITY)
Admission: RE | Admit: 2014-09-11 | Discharge: 2014-09-11 | Disposition: A | Discharge status: Home / Self Care | Payer: Self-pay | Source: Ambulatory Visit | Attending: Obstetrics & Gynecology | Admitting: Obstetrics & Gynecology

## 2014-09-11 ENCOUNTER — Encounter: Payer: Self-pay | Admitting: Obstetrics and Gynecology

## 2014-09-11 ENCOUNTER — Encounter: Payer: Self-pay | Attending: Family Medicine | Admitting: *Deleted

## 2014-09-11 ENCOUNTER — Encounter (HOSPITAL_COMMUNITY): Payer: Self-pay

## 2014-09-11 ENCOUNTER — Encounter: Payer: Self-pay | Admitting: Obstetrics & Gynecology

## 2014-09-11 VITALS — BP 119/82 | HR 88 | Temp 98.3°F | Wt 179.8 lb

## 2014-09-11 DIAGNOSIS — R7309 Other abnormal glucose: Secondary | ICD-10-CM

## 2014-09-11 DIAGNOSIS — Z3A34 34 weeks gestation of pregnancy: Secondary | ICD-10-CM | POA: Insufficient documentation

## 2014-09-11 DIAGNOSIS — O24419 Gestational diabetes mellitus in pregnancy, unspecified control: Secondary | ICD-10-CM | POA: Insufficient documentation

## 2014-09-11 DIAGNOSIS — R7302 Impaired glucose tolerance (oral): Secondary | ICD-10-CM

## 2014-09-11 DIAGNOSIS — O0933 Supervision of pregnancy with insufficient antenatal care, third trimester: Secondary | ICD-10-CM

## 2014-09-11 DIAGNOSIS — O0993 Supervision of high risk pregnancy, unspecified, third trimester: Secondary | ICD-10-CM

## 2014-09-11 DIAGNOSIS — Z713 Dietary counseling and surveillance: Secondary | ICD-10-CM | POA: Insufficient documentation

## 2014-09-11 LAB — POCT URINALYSIS DIP (DEVICE)
BILIRUBIN URINE: NEGATIVE
Glucose, UA: NEGATIVE mg/dL
KETONES UR: NEGATIVE mg/dL
Nitrite: NEGATIVE
PH: 6 (ref 5.0–8.0)
Protein, ur: NEGATIVE mg/dL
SPECIFIC GRAVITY, URINE: 1.01 (ref 1.005–1.030)
Urobilinogen, UA: 0.2 mg/dL (ref 0.0–1.0)

## 2014-09-11 NOTE — Progress Notes (Signed)
Ultrasound scheduled for 09/11/2014 @ 1:15 PM.

## 2014-09-11 NOTE — Progress Notes (Signed)
Subjective:  Brandy Burnett is a 29 y.o. M [place] G2P0010 at [redacted]w[redacted]d being seen today for ongoing prenatal care.  Patient reports no complaints.   .   .  . Denies leaking of fluid.   The following portions of the patient's history were reviewed and updated as appropriate: allergies, current medications, past family history, past medical history, past social history, past surgical history and problem list.   Objective:   Filed Vitals:   09/11/14 1028  BP: 119/82  Pulse: 88  Temp: 98.3 F (36.8 C)  Weight: 179 lb 12.8 oz (81.557 kg)    Fetal Status:           General:  Alert, oriented and cooperative. Patient is in no acute distress.  Skin: Skin is warm and dry. No rash noted.   Cardiovascular: Normal heart rate noted  Respiratory: Normal respiratory effort, no problems with respiration noted  Abdomen: Soft, gravid, appropriate for gestational age.       Vaginal:  .       Cervix: Not evaluated        Extremities: Normal range of motion.     Mental Status: Normal mood and affect. Normal behavior. Normal judgment and thought content.   Urinalysis:      Assessment and Plan:  Pregnancy: G2P0010 at [redacted]w[redacted]d  1. Gestational diabetes mellitus, antepartum  - POCT CBG (Fasting - Glucose) - US OB Detail + 14 Wk; Future - Biophysical profile - She is meeting with the diabetes coordinator today. 2. Elevated glucose tolerance test   3. Supervision of high-risk pregnancy, third trimester   4. Limited prenatal care in third trimester   Preterm labor symptoms and general obstetric precautions including but not limited to vaginal bleeding, contractions, leaking of fluid and fetal movement were reviewed in detail with the patient. Please refer to After Visit Summary for other counseling recommendations.  No Follow-up on file.   Allie Bossier, MD

## 2014-09-11 NOTE — Progress Notes (Signed)
Nutrition Note: Pt. Seen today for GDM management.  Reviewed current diet.  Eating 2 meals and 2 snacks daily.  Drinking water and milk.   Discussed importance of 3 meals and 2-3 snacks/d with carbohydrate and protein pairing for glucose control.  Discussed incorporating exercise as able.  PNV daily. F/U 1-2 weeks. Candice C. Earlene Plater, MPH, RD, LDN

## 2014-09-11 NOTE — Progress Notes (Signed)
  Patient was seen on 09/11/14 for Gestational Diabetes self-management . [employer] Interpre[employer] [place] [facility] assisted. Handout provided in Arabic. Husband appeared to understand most discussion. The following learning objectives were met by the patient :   States the definition of Gestational Diabetes  States when to check blood glucose levels  Demonstrates proper blood glucose monitoring techniques  States the effect of stress and exercise on blood glucose levels  States the importance of limiting caffeine and abstaining from alcohol and smoking  Plan:  Begin reading food labels for Total Carbohydrate and sugar grams of foods Consider  increasing your activity level by walking daily as tolerated Begin checking BG before breakfast and 1-2 hours after first bit of breakfast, lunch and dinner after  as directed by MD  Take medication  as directed by MD  Blood glucose monitor given: [employer] Lot # [id number] Exp: 2016/09/09 Blood glucose reading: $RemoveBeforeDE'110mg'EiHDbRDMEjWcrAH$ /dl following a banana for breakfast  Patient instructed to monitor glucose levels: FBS: 60 - <90 2 hour: <120  Patient received the following handouts:  Nutrition Diabetes and Pregnancy  Carbohydrate Counting List  Meal Planning worksheet  Patient will be seen for follow-up as needed.

## 2014-09-12 ENCOUNTER — Encounter: Payer: Self-pay | Admitting: Obstetrics and Gynecology

## 2014-09-13 ENCOUNTER — Ambulatory Visit (HOSPITAL_COMMUNITY): Payer: Self-pay

## 2014-09-18 ENCOUNTER — Encounter: Payer: Self-pay | Admitting: Obstetrics and Gynecology

## 2014-09-18 ENCOUNTER — Ambulatory Visit (INDEPENDENT_AMBULATORY_CARE_PROVIDER_SITE_OTHER): Payer: Self-pay | Admitting: Obstetrics and Gynecology

## 2014-09-18 VITALS — BP 120/77 | HR 63 | Temp 98.0°F | Wt 181.3 lb

## 2014-09-18 DIAGNOSIS — O24419 Gestational diabetes mellitus in pregnancy, unspecified control: Secondary | ICD-10-CM

## 2014-09-18 DIAGNOSIS — O0990 Supervision of high risk pregnancy, unspecified, unspecified trimester: Secondary | ICD-10-CM

## 2014-09-18 DIAGNOSIS — O0993 Supervision of high risk pregnancy, unspecified, third trimester: Secondary | ICD-10-CM

## 2014-09-18 LAB — POCT URINALYSIS DIP (DEVICE)
Bilirubin Urine: NEGATIVE
GLUCOSE, UA: NEGATIVE mg/dL
HGB URINE DIPSTICK: NEGATIVE
Ketones, ur: NEGATIVE mg/dL
Leukocytes, UA: NEGATIVE
NITRITE: NEGATIVE
PH: 6 (ref 5.0–8.0)
Protein, ur: NEGATIVE mg/dL
SPECIFIC GRAVITY, URINE: 1.01 (ref 1.005–1.030)
UROBILINOGEN UA: 0.2 mg/dL (ref 0.0–1.0)

## 2014-09-18 LAB — OB RESULTS CONSOLE GC/CHLAMYDIA
Chlamydia: NEGATIVE
GC PROBE AMP, GENITAL: NEGATIVE

## 2014-09-18 LAB — OB RESULTS CONSOLE GBS: GBS: NEGATIVE

## 2014-09-18 MED ORDER — GLYBURIDE 2.5 MG PO TABS
1.2500 mg | ORAL_TABLET | Freq: Every day | ORAL | Status: DC
Start: 2014-09-18 — End: 2014-09-18

## 2014-09-18 MED ORDER — PRENATAL VITAMINS 0.8 MG PO TABS: 1.0000 | ORAL_TABLET | Freq: Every day | ORAL | Status: AC

## 2014-09-18 MED ORDER — GLYBURIDE 2.5 MG PO TABS: 1.2500 mg | ORAL_TABLET | Freq: Two times a day (BID) | ORAL | Status: DC

## 2014-09-18 NOTE — Progress Notes (Signed)
Subjective:  Brandy Burnett is a 29 y.o. G2P0010 at [redacted]w[redacted]d being seen today for ongoing prenatal care.  Patient reports no complaints. Brings sugar log. 3 fastings > 95. 10 of 21 2-hour post-prandials greater than 120 Contractions: Not present.  Vag. Bleeding: None. Movement: Present. Denies leaking of fluid.   The following portions of the patient's history were reviewed and updated as appropriate: allergies, current medications, past family history, past medical history, past social history, past surgical history and problem list.   Objective:   Filed Vitals:   09/18/14 0759  BP: 120/77  Pulse: 63  Temp: 98 F (36.7 C)  Weight: 181 lb 4.8 oz (82.237 kg)    Fetal Status: Fetal Heart Rate (bpm): 124   Movement: Present     General:  Alert, oriented and cooperative. Patient is in no acute distress.  Skin: Skin is warm and dry. No rash noted.   Cardiovascular: Normal heart rate noted  Respiratory: Normal respiratory effort, no problems with respiration noted  Abdomen: Soft, gravid, appropriate for gestational age. Pain/Pressure: Present     Pelvic: Vag. Bleeding: None Vag D/C Character: White   Cervical exam deferred        Extremities: Normal range of motion.  Edema: None  Mental Status: Normal mood and affect. Normal behavior. Normal judgment and thought content.   Urinalysis: Urine Protein: Negative Urine Glucose: Negative  Assessment and Plan:  Pregnancy: G2P0010 at [redacted]w[redacted]d  1. Gestational diabetes mellitus, antepartum - sugars above goal, starting glyburide 1.25 mg po bid - starting twice weekly antenatal testing today, NST reactive today - growth u/s scheduled in ~ 2 weeks  2. Late prenatal care - gc/chlamydia/gbs obtained today  Preterm labor symptoms and general obstetric precautions including but not limited to vaginal bleeding, contractions, leaking of fluid and fetal movement were reviewed in detail with the patient. Please refer to After Visit Summary for other  counseling recommendations.  No Follow-up on file.   Kathrynn Running, MD

## 2014-09-18 NOTE — Progress Notes (Signed)
Manhel Ahmed used for interpreter

## 2014-09-19 LAB — GC/CHLAMYDIA PROBE AMP
CT Probe RNA: NEGATIVE
GC Probe RNA: NEGATIVE

## 2014-09-20 ENCOUNTER — Other Ambulatory Visit: Payer: Self-pay | Admitting: Obstetrics & Gynecology

## 2014-09-20 DIAGNOSIS — O0933 Supervision of pregnancy with insufficient antenatal care, third trimester: Secondary | ICD-10-CM

## 2014-09-20 DIAGNOSIS — O24319 Unspecified pre-existing diabetes mellitus in pregnancy, unspecified trimester: Secondary | ICD-10-CM

## 2014-09-20 DIAGNOSIS — Z3A37 37 weeks gestation of pregnancy: Secondary | ICD-10-CM

## 2014-09-20 DIAGNOSIS — R7309 Other abnormal glucose: Secondary | ICD-10-CM

## 2014-09-20 LAB — CULTURE, BETA STREP (GROUP B ONLY)

## 2014-09-22 ENCOUNTER — Ambulatory Visit (INDEPENDENT_AMBULATORY_CARE_PROVIDER_SITE_OTHER): Payer: Self-pay | Admitting: *Deleted

## 2014-09-22 ENCOUNTER — Other Ambulatory Visit: Payer: Self-pay

## 2014-09-22 ENCOUNTER — Encounter: Payer: Self-pay | Admitting: Obstetrics and Gynecology

## 2014-09-22 VITALS — BP 113/70 | HR 105

## 2014-09-22 DIAGNOSIS — O0993 Supervision of high risk pregnancy, unspecified, third trimester: Secondary | ICD-10-CM

## 2014-09-22 DIAGNOSIS — O0933 Supervision of pregnancy with insufficient antenatal care, third trimester: Secondary | ICD-10-CM

## 2014-09-22 DIAGNOSIS — O24419 Gestational diabetes mellitus in pregnancy, unspecified control: Secondary | ICD-10-CM

## 2014-09-22 NOTE — Progress Notes (Signed)
Interpreter present for encounter.  Pt reports that some days she has 6-7 hrs with no FM.  She felt good FM during NST. Kick counts discussed and recommended if she has 4 hrs with no FM. She and husband voiced understanding.

## 2014-09-25 ENCOUNTER — Ambulatory Visit (INDEPENDENT_AMBULATORY_CARE_PROVIDER_SITE_OTHER): Payer: Self-pay | Admitting: Family Medicine

## 2014-09-25 VITALS — BP 127/77 | HR 93 | Temp 98.3°F | Wt 183.3 lb

## 2014-09-25 DIAGNOSIS — O0933 Supervision of pregnancy with insufficient antenatal care, third trimester: Secondary | ICD-10-CM

## 2014-09-25 DIAGNOSIS — O24419 Gestational diabetes mellitus in pregnancy, unspecified control: Secondary | ICD-10-CM

## 2014-09-25 LAB — POCT URINALYSIS DIP (DEVICE)
BILIRUBIN URINE: NEGATIVE
Glucose, UA: NEGATIVE mg/dL
HGB URINE DIPSTICK: NEGATIVE
KETONES UR: NEGATIVE mg/dL
Leukocytes, UA: NEGATIVE
Nitrite: NEGATIVE
PH: 5.5 (ref 5.0–8.0)
PROTEIN: NEGATIVE mg/dL
SPECIFIC GRAVITY, URINE: 1.01 (ref 1.005–1.030)
Urobilinogen, UA: 0.2 mg/dL (ref 0.0–1.0)

## 2014-09-25 MED ORDER — GLYBURIDE 2.5 MG PO TABS: 2.5000 mg | ORAL_TABLET | Freq: Two times a day (BID) | ORAL | Status: DC

## 2014-09-25 NOTE — Progress Notes (Signed)
Subjective:  Brandy Burnett is a 29 y.o. G2P0010 at [redacted]w[redacted]d being seen today for ongoing prenatal care.  Patient reports no complaints.  Contractions: Not present.  Vag. Bleeding: None. Movement: Present. Denies leaking of fluid.   The following portions of the patient's history were reviewed and updated as appropriate: allergies, current medications, past family history, past medical history, past social history, past surgical history and problem list.   Objective:   Filed Vitals:   09/25/14 1034  BP: 127/77  Pulse: 93  Temp: 98.3 F (36.8 C)  Weight: 183 lb 4.8 oz (83.144 kg)    Fetal Status: Fetal Heart Rate (bpm): 159 Fundal Height: 34 cm Movement: Present  Presentation: Vertex  General:  Alert, oriented and cooperative. Patient is in no acute distress.  Skin: Skin is warm and dry. No rash noted.   Cardiovascular: Normal heart rate noted  Respiratory: Normal respiratory effort, no problems with respiration noted  Abdomen: Soft, gravid, appropriate for gestational age. Pain/Pressure: Present     Pelvic: Vag. Bleeding: None     Cervical exam deferred        Extremities: Normal range of motion.  Edema: None  Mental Status: Normal mood and affect. Normal behavior. Normal judgment and thought content.   Urinalysis: Urine Protein: Negative Urine Glucose: Negative  Assessment and Plan:  Pregnancy: G2P0010 at [redacted]w[redacted]d  1. Late prenatal care affecting pregnancy in third trimester, antepartum Continue routine prenatal care.  2. Gestational diabetes mellitus, antepartum Increase glyburide to 2.5 q am 1.25 q hs Continue 2x/wk NST's, with AFI IOL at 39 wks U/s for growth next week. - glyBURIDE (DIABETA) 2.5 MG tablet; Take 1 tablet (2.5 mg total) by mouth 2 (two) times daily with a meal. Take 1 pill in am and 1/2 pill q hs  Dispense: 30 tablet; Refill: 3  Term labor symptoms and general obstetric precautions including but not limited to vaginal bleeding, contractions, leaking of fluid and  fetal movement were reviewed in detail with the patient. Please refer to After Visit Summary for other counseling recommendations.  Return in 1 week (on 10/02/2014) for OB visit and NST, HRC, NST only in 4 days.   Reva Bores, MD

## 2014-09-25 NOTE — Patient Instructions (Signed)
Third Trimester of Pregnancy The third trimester is from week 29 through week 42, months 7 through 9. The third trimester is a time when the fetus is growing rapidly. At the end of the ninth month, the fetus is about 20 inches in length and weighs 6-10 pounds.  BODY CHANGES Your body goes through many changes during pregnancy. The changes vary from woman to woman.   Your weight will continue to increase. You can expect to gain 25-35 pounds (11-16 kg) by the end of the pregnancy.  You may begin to get stretch marks on your hips, abdomen, and breasts.  You may urinate more often because the fetus is moving lower into your pelvis and pressing on your bladder.  You may develop or continue to have heartburn as a result of your pregnancy.  You may develop constipation because certain hormones are causing the muscles that push waste through your intestines to slow down.  You may develop hemorrhoids or swollen, bulging veins (varicose veins).  You may have pelvic pain because of the weight gain and pregnancy hormones relaxing your joints between the bones in your pelvis. Backaches may result from overexertion of the muscles supporting your posture.  You may have changes in your hair. These can include thickening of your hair, rapid growth, and changes in texture. Some women also have hair loss during or after pregnancy, or hair that feels dry or thin. Your hair will most likely return to normal after your baby is born.  Your breasts will continue to grow and be tender. A yellow discharge may leak from your breasts called colostrum.  Your belly button may stick out.  You may feel short of breath because of your expanding uterus.  You may notice the fetus "dropping," or moving lower in your abdomen.  You may have a bloody mucus discharge. This usually occurs a few days to a week before labor begins.  Your cervix becomes thin and soft (effaced) near your due date. WHAT TO EXPECT AT YOUR  PRENATAL EXAMS  You will have prenatal exams every 2 weeks until week 36. Then, you will have weekly prenatal exams. During a routine prenatal visit:  You will be weighed to make sure you and the fetus are growing normally.  Your blood pressure is taken.  Your abdomen will be measured to track your baby's growth.  The fetal heartbeat will be listened to.  Any test results from the previous visit will be discussed.  You may have a cervical check near your due date to see if you have effaced. At around 36 weeks, your caregiver will check your cervix. At the same time, your caregiver will also perform a test on the secretions of the vaginal tissue. This test is to determine if a type of bacteria, Group B streptococcus, is present. Your caregiver will explain this further. Your caregiver may ask you:  What your birth plan is.  How you are feeling.  If you are feeling the baby move.  If you have had any abnormal symptoms, such as leaking fluid, bleeding, severe headaches, or abdominal cramping.  If you have any questions. Other tests or screenings that may be performed during your third trimester include:  Blood tests that check for low iron levels (anemia).  Fetal testing to check the health, activity level, and growth of the fetus. Testing is done if you have certain medical conditions or if there are problems during the pregnancy. FALSE LABOR You may feel small, irregular contractions that   eventually go away. These are called Braxton Hicks contractions, or false labor. Contractions may last for hours, days, or even weeks before true labor sets in. If contractions come at regular intervals, intensify, or become painful, it is best to be seen by your caregiver.  SIGNS OF LABOR   Menstrual-like cramps.  Contractions that are 5 minutes apart or less.  Contractions that start on the top of the uterus and spread down to the lower abdomen and back.  A sense of increased pelvic  pressure or back pain.  A watery or bloody mucus discharge that comes from the vagina. If you have any of these signs before the 37th week of pregnancy, call your caregiver right away. You need to go to the hospital to get checked immediately. HOME CARE INSTRUCTIONS   Avoid all smoking, herbs, alcohol, and unprescribed drugs. These chemicals affect the formation and growth of the baby.  Follow your caregiver's instructions regarding medicine use. There are medicines that are either safe or unsafe to take during pregnancy.  Exercise only as directed by your caregiver. Experiencing uterine cramps is a good sign to stop exercising.  Continue to eat regular, healthy meals.  Wear a good support bra for breast tenderness.  Do not use hot tubs, steam rooms, or saunas.  Wear your seat belt at all times when driving.  Avoid raw meat, uncooked cheese, cat litter boxes, and soil used by cats. These carry germs that can cause birth defects in the baby.  Take your prenatal vitamins.  Try taking a stool softener (if your caregiver approves) if you develop constipation. Eat more high-fiber foods, such as fresh vegetables or fruit and whole grains. Drink plenty of fluids to keep your urine clear or pale yellow.  Take warm sitz baths to soothe any pain or discomfort caused by hemorrhoids. Use hemorrhoid cream if your caregiver approves.  If you develop varicose veins, wear support hose. Elevate your feet for 15 minutes, 3-4 times a day. Limit salt in your diet.  Avoid heavy lifting, wear low heal shoes, and practice good posture.  Rest a lot with your legs elevated if you have leg cramps or low back pain.  Visit your dentist if you have not gone during your pregnancy. Use a soft toothbrush to brush your teeth and be gentle when you floss.  A sexual relationship may be continued unless your caregiver directs you otherwise.  Do not travel far distances unless it is absolutely necessary and only  with the approval of your caregiver.  Take prenatal classes to understand, practice, and ask questions about the labor and delivery.  Make a trial run to the hospital.  Pack your hospital bag.  Prepare the baby's nursery.  Continue to go to all your prenatal visits as directed by your caregiver. SEEK MEDICAL CARE IF:  You are unsure if you are in labor or if your water has broken.  You have dizziness.  You have mild pelvic cramps, pelvic pressure, or nagging pain in your abdominal area.  You have persistent nausea, vomiting, or diarrhea.  You have a bad smelling vaginal discharge.  You have pain with urination. SEEK IMMEDIATE MEDICAL CARE IF:   You have a fever.  You are leaking fluid from your vagina.  You have spotting or bleeding from your vagina.  You have severe abdominal cramping or pain.  You have rapid weight loss or gain.  You have shortness of breath with chest pain.  You notice sudden or extreme swelling   of your face, hands, ankles, feet, or legs.  You have not felt your baby move in over an hour.  You have severe headaches that do not go away with medicine.  You have vision changes. Document Released: 01/21/2001 Document Revised: 02/01/2013 Document Reviewed: 03/30/2012 [employer] Patient Information 2015 [employer]. This information is not intended to replace advice given to you by your health care provider. Make sure you discuss any questions you have with your health care provider.  Breastfeeding Deciding to breastfeed is one of the best choices you can make for you and your baby. A change in hormones during pregnancy causes your breast tissue to grow and increases the number and size of your milk ducts. These hormones also allow proteins, sugars, and fats from your blood supply to make breast milk in your milk-producing glands. Hormones prevent breast milk from being released before your baby is born as well as prompt milk flow after birth. Once  breastfeeding has begun, thoughts of your baby, as well as his or her sucking or crying, can stimulate the release of milk from your milk-producing glands.  BENEFITS OF BREASTFEEDING For Your Baby  Your first milk (colostrum) helps your baby's digestive system function better.   There are antibodies in your milk that help your baby fight off infections.   Your baby has a lower incidence of asthma, allergies, and sudden infant death syndrome.   The nutrients in breast milk are better for your baby than infant formulas and are designed uniquely for your baby's needs.   Breast milk improves your baby's brain development.   Your baby is less likely to develop other conditions, such as childhood obesity, asthma, or type 2 diabetes mellitus.  For You   Breastfeeding helps to create a very special bond between you and your baby.   Breastfeeding is convenient. Breast milk is always available at the correct temperature and costs nothing.   Breastfeeding helps to burn calories and helps you lose the weight gained during pregnancy.   Breastfeeding makes your uterus contract to its prepregnancy size faster and slows bleeding (lochia) after you give birth.   Breastfeeding helps to lower your risk of developing type 2 diabetes mellitus, osteoporosis, and breast or ovarian cancer later in life. SIGNS THAT YOUR BABY IS HUNGRY Early Signs of Hunger  Increased alertness or activity.  Stretching.  Movement of the head from side to side.  Movement of the head and opening of the mouth when the corner of the mouth or cheek is stroked (rooting).  Increased sucking sounds, smacking lips, cooing, sighing, or squeaking.  Hand-to-mouth movements.  Increased sucking of fingers or hands. Late Signs of Hunger  Fussing.  Intermittent crying. Extreme Signs of Hunger Signs of extreme hunger will require calming and consoling before your baby will be able to breastfeed successfully. Do not  wait for the following signs of extreme hunger to occur before you initiate breastfeeding:   Restlessness.  A loud, strong cry.   Screaming. BREASTFEEDING BASICS Breastfeeding Initiation  Find a comfortable place to sit or lie down, with your neck and back well supported.  Place a pillow or rolled up blanket under your baby to bring him or her to the level of your breast (if you are seated). Nursing pillows are specially designed to help support your arms and your baby while you breastfeed.  Make sure that your baby's abdomen is facing your abdomen.   Gently massage your breast. With your fingertips, massage from your chest   wall toward your nipple in a circular motion. This encourages milk flow. You may need to continue this action during the feeding if your milk flows slowly.  Support your breast with 4 fingers underneath and your thumb above your nipple. Make sure your fingers are well away from your nipple and your baby's mouth.   Stroke your baby's lips gently with your finger or nipple.   When your baby's mouth is open wide enough, quickly bring your baby to your breast, placing your entire nipple and as much of the colored area around your nipple (areola) as possible into your baby's mouth.   More areola should be visible above your baby's upper lip than below the lower lip.   Your baby's tongue should be between his or her lower gum and your breast.   Ensure that your baby's mouth is correctly positioned around your nipple (latched). Your baby's lips should create a seal on your breast and be turned out (everted).  It is common for your baby to suck about 2-3 minutes in order to start the flow of breast milk. Latching Teaching your baby how to latch on to your breast properly is very important. An improper latch can cause nipple pain and decreased milk supply for you and poor weight gain in your baby. Also, if your baby is not latched onto your nipple properly, he or she  may swallow some air during feeding. This can make your baby fussy. Burping your baby when you switch breasts during the feeding can help to get rid of the air. However, teaching your baby to latch on properly is still the best way to prevent fussiness from swallowing air while breastfeeding. Signs that your baby has successfully latched on to your nipple:    Silent tugging or silent sucking, without causing you pain.   Swallowing heard between every 3-4 sucks.    Muscle movement above and in front of his or her ears while sucking.  Signs that your baby has not successfully latched on to nipple:   Sucking sounds or smacking sounds from your baby while breastfeeding.  Nipple pain. If you think your baby has not latched on correctly, slip your finger into the corner of your baby's mouth to break the suction and place it between your baby's gums. Attempt breastfeeding initiation again. Signs of Successful Breastfeeding Signs from your baby:   A gradual decrease in the number of sucks or complete cessation of sucking.   Falling asleep.   Relaxation of his or her body.   Retention of a small amount of milk in his or her mouth.   Letting go of your breast by himself or herself. Signs from you:  Breasts that have increased in firmness, weight, and size 1-3 hours after feeding.   Breasts that are softer immediately after breastfeeding.  Increased milk volume, as well as a change in milk consistency and color by the fifth day of breastfeeding.   Nipples that are not sore, cracked, or bleeding. Signs That Your Baby is Getting Enough Milk  Wetting at least 3 diapers in a 24-hour period. The urine should be clear and pale yellow by age 5 days.  At least 3 stools in a 24-hour period by age 5 days. The stool should be soft and yellow.  At least 3 stools in a 24-hour period by age 7 days. The stool should be seedy and yellow.  No loss of weight greater than 10% of birth weight  during the first 3   days of age.  Average weight gain of 4-7 ounces (113-198 g) per week after age 4 days.  Consistent daily weight gain by age 5 days, without weight loss after the age of 2 weeks. After a feeding, your baby may spit up a small amount. This is common. BREASTFEEDING FREQUENCY AND DURATION Frequent feeding will help you make more milk and can prevent sore nipples and breast engorgement. Breastfeed when you feel the need to reduce the fullness of your breasts or when your baby shows signs of hunger. This is called "breastfeeding on demand." Avoid introducing a pacifier to your baby while you are working to establish breastfeeding (the first 4-6 weeks after your baby is born). After this time you may choose to use a pacifier. Research has shown that pacifier use during the first year of a baby's life decreases the risk of sudden infant death syndrome (SIDS). Allow your baby to feed on each breast as long as he or she wants. Breastfeed until your baby is finished feeding. When your baby unlatches or falls asleep while feeding from the first breast, offer the second breast. Because newborns are often sleepy in the first few weeks of life, you may need to awaken your baby to get him or her to feed. Breastfeeding times will vary from baby to baby. However, the following rules can serve as a guide to help you ensure that your baby is properly fed:  Newborns (babies 4 weeks of age or younger) may breastfeed every 1-3 hours.  Newborns should not go longer than 3 hours during the day or 5 hours during the night without breastfeeding.  You should breastfeed your baby a minimum of 8 times in a 24-hour period until you begin to introduce solid foods to your baby at around 6 months of age. BREAST MILK PUMPING Pumping and storing breast milk allows you to ensure that your baby is exclusively fed your breast milk, even at times when you are unable to breastfeed. This is especially important if you are  going back to work while you are still breastfeeding or when you are not able to be present during feedings. Your lactation consultant can give you guidelines on how long it is safe to store breast milk.  A breast pump is a machine that allows you to pump milk from your breast into a sterile bottle. The pumped breast milk can then be stored in a refrigerator or freezer. Some breast pumps are operated by hand, while others use electricity. Ask your lactation consultant which type will work best for you. Breast pumps can be purchased, but some hospitals and breastfeeding support groups lease breast pumps on a monthly basis. A lactation consultant can teach you how to hand express breast milk, if you prefer not to use a pump.  CARING FOR YOUR BREASTS WHILE YOU BREASTFEED Nipples can become dry, cracked, and sore while breastfeeding. The following recommendations can help keep your breasts moisturized and healthy:  Avoid using soap on your nipples.   Wear a supportive bra. Although not required, special nursing bras and tank tops are designed to allow access to your breasts for breastfeeding without taking off your entire bra or top. Avoid wearing underwire-style bras or extremely tight bras.  Air dry your nipples for 3-4minutes after each feeding.   Use only cotton bra pads to absorb leaked breast milk. Leaking of breast milk between feedings is normal.   Use lanolin on your nipples after breastfeeding. Lanolin helps to maintain your skin's   normal moisture barrier. If you use pure lanolin, you do not need to wash it off before feeding your baby again. Pure lanolin is not toxic to your baby. You may also hand express a few drops of breast milk and gently massage that milk into your nipples and allow the milk to air dry. In the first few weeks after giving birth, some women experience extremely full breasts (engorgement). Engorgement can make your breasts feel heavy, warm, and tender to the touch.  Engorgement peaks within 3-5 days after you give birth. The following recommendations can help ease engorgement:  Completely empty your breasts while breastfeeding or pumping. You may want to start by applying warm, moist heat (in the shower or with warm water-soaked hand towels) just before feeding or pumping. This increases circulation and helps the milk flow. If your baby does not completely empty your breasts while breastfeeding, pump any extra milk after he or she is finished.  Wear a snug bra (nursing or regular) or tank top for 1-2 days to signal your body to slightly decrease milk production.  Apply ice packs to your breasts, unless this is too uncomfortable for you.  Make sure that your baby is latched on and positioned properly while breastfeeding. If engorgement persists after 48 hours of following these recommendations, contact your health care provider or a lactation consultant. OVERALL HEALTH CARE RECOMMENDATIONS WHILE BREASTFEEDING  Eat healthy foods. Alternate between meals and snacks, eating 3 of each per day. Because what you eat affects your breast milk, some of the foods may make your baby more irritable than usual. Avoid eating these foods if you are sure that they are negatively affecting your baby.  Drink milk, fruit juice, and water to satisfy your thirst (about 10 glasses a day).   Rest often, relax, and continue to take your prenatal vitamins to prevent fatigue, stress, and anemia.  Continue breast self-awareness checks.  Avoid chewing and smoking tobacco.  Avoid alcohol and drug use. Some medicines that may be harmful to your baby can pass through breast milk. It is important to ask your health care provider before taking any medicine, including all over-the-counter and prescription medicine as well as vitamin and herbal supplements. It is possible to become pregnant while breastfeeding. If birth control is desired, ask your health care provider about options that  will be safe for your baby. SEEK MEDICAL CARE IF:   You feel like you want to stop breastfeeding or have become frustrated with breastfeeding.  You have painful breasts or nipples.  Your nipples are cracked or bleeding.  Your breasts are red, tender, or warm.  You have a swollen area on either breast.  You have a fever or chills.  You have nausea or vomiting.  You have drainage other than breast milk from your nipples.  Your breasts do not become full before feedings by the fifth day after you give birth.  You feel sad and depressed.  Your baby is too sleepy to eat well.  Your baby is having trouble sleeping.   Your baby is wetting less than 3 diapers in a 24-hour period.  Your baby has less than 3 stools in a 24-hour period.  Your baby's skin or the white part of his or her eyes becomes yellow.   Your baby is not gaining weight by 5 days of age. SEEK IMMEDIATE MEDICAL CARE IF:   Your baby is overly tired (lethargic) and does not want to wake up and feed.  Your baby   develops an unexplained fever. Document Released: 01/27/2005 Document Revised: 02/01/2013 Document Reviewed: 07/21/2012 [employer] Patient Information 2015 [employer]. This information is not intended to replace advice given to you by your health care provider. Make sure you discuss any questions you have with your health care provider.  

## 2014-09-25 NOTE — Progress Notes (Signed)
NST reviewed and reactive.  

## 2014-09-25 NOTE — Progress Notes (Signed)
Interpreter Feryal Yousef Pain/pressure- lower abd. Bilateral legs more on right side Pt reports having hemorroids; pt given otc medication list that advises preprepration H and Tucks.

## 2014-09-29 ENCOUNTER — Ambulatory Visit (INDEPENDENT_AMBULATORY_CARE_PROVIDER_SITE_OTHER): Payer: Self-pay | Admitting: *Deleted

## 2014-09-29 VITALS — BP 114/68 | HR 110

## 2014-09-29 DIAGNOSIS — O24419 Gestational diabetes mellitus in pregnancy, unspecified control: Secondary | ICD-10-CM

## 2014-09-29 NOTE — Progress Notes (Signed)
NST reviewed and reactive.  

## 2014-09-29 NOTE — Progress Notes (Signed)
Interpreter present for encounter.  Pt again reporting decreased FM - felt some FM during NST.  Kick counts reveiwed again and emphasized importance of performing at home for her own reassurance as well as to know whether hospital visit is warranted. Husband present for visit. Both pt and husband voice understanding of instructions.

## 2014-10-02 ENCOUNTER — Encounter: Payer: Self-pay | Admitting: Family Medicine

## 2014-10-02 ENCOUNTER — Ambulatory Visit (INDEPENDENT_AMBULATORY_CARE_PROVIDER_SITE_OTHER): Payer: Self-pay | Admitting: Family Medicine

## 2014-10-02 ENCOUNTER — Ambulatory Visit (HOSPITAL_COMMUNITY)
Admission: RE | Admit: 2014-10-02 | Discharge: 2014-10-02 | Disposition: A | Discharge status: Home / Self Care | Payer: Self-pay | Source: Ambulatory Visit | Attending: Obstetrics & Gynecology | Admitting: Obstetrics & Gynecology

## 2014-10-02 VITALS — BP 107/74 | HR 114 | Wt 183.1 lb

## 2014-10-02 DIAGNOSIS — O0933 Supervision of pregnancy with insufficient antenatal care, third trimester: Secondary | ICD-10-CM

## 2014-10-02 DIAGNOSIS — O0993 Supervision of high risk pregnancy, unspecified, third trimester: Secondary | ICD-10-CM

## 2014-10-02 DIAGNOSIS — O24319 Unspecified pre-existing diabetes mellitus in pregnancy, unspecified trimester: Secondary | ICD-10-CM

## 2014-10-02 DIAGNOSIS — O24419 Gestational diabetes mellitus in pregnancy, unspecified control: Secondary | ICD-10-CM

## 2014-10-02 DIAGNOSIS — R7309 Other abnormal glucose: Secondary | ICD-10-CM

## 2014-10-02 DIAGNOSIS — Z23 Encounter for immunization: Secondary | ICD-10-CM

## 2014-10-02 DIAGNOSIS — E119 Type 2 diabetes mellitus without complications: Secondary | ICD-10-CM | POA: Insufficient documentation

## 2014-10-02 DIAGNOSIS — Z3A37 37 weeks gestation of pregnancy: Secondary | ICD-10-CM

## 2014-10-02 LAB — POCT URINALYSIS DIP (DEVICE)
Bilirubin Urine: NEGATIVE
GLUCOSE, UA: NEGATIVE mg/dL
HGB URINE DIPSTICK: NEGATIVE
KETONES UR: NEGATIVE mg/dL
Nitrite: NEGATIVE
PROTEIN: NEGATIVE mg/dL
SPECIFIC GRAVITY, URINE: 1.015 (ref 1.005–1.030)
Urobilinogen, UA: 0.2 mg/dL (ref 0.0–1.0)
pH: 6 (ref 5.0–8.0)

## 2014-10-02 NOTE — Progress Notes (Signed)
Subjective:  Brandy Burnett is a 29 y.o. G2P0010 at [redacted]w[redacted]d being seen today for ongoing prenatal care.  Patient reports no complaints.  Contractions: Not present.  Vag. Bleeding: None. Movement: Present. Denies leaking of fluid.   The following portions of the patient's history were reviewed and updated as appropriate: allergies, current medications, past family history, past medical history, past social history, past surgical history and problem list.   Objective:   Filed Vitals:   10/02/14 1037  BP: 107/74  Pulse: 114  Weight: 183 lb 1.6 oz (83.054 kg)    Fetal Status: Fetal Heart Rate (bpm): NST   Movement: Present  Presentation: Vertex  General:  Alert, oriented and cooperative. Patient is in no acute distress.  Skin: Skin is warm and dry. No rash noted.   Cardiovascular: Normal heart rate noted  Respiratory: Normal respiratory effort, no problems with respiration noted  Abdomen: Soft, gravid, appropriate for gestational age. Pain/Pressure: Present     Pelvic: Vag. Bleeding: None     Cervical exam performed Dilation: 1 Effacement (%): 50 Station: -3 small hemorrhoid  Extremities: Normal range of motion.  Edema: Trace  Mental Status: Normal mood and affect. Normal behavior. Normal judgment and thought content.   Urinalysis: Urine Protein: Negative Urine Glucose: Negative NST reviewed and reactive. FBS 82-102 2 hour pp 77-183 Assessment and Plan:  Pregnancy: G2P0010 at [redacted]w[redacted]d  1. Gestational diabetes mellitus, class A2  - Fetal nonstress test - Flu Vaccine QUAD 36+ mos IM; Standing - Flu Vaccine QUAD 36+ mos IM  2. Supervision of high-risk pregnancy, third trimester Continue routine prenatal care.  3. Limited prenatal care in third trimester Term labor symptoms and general obstetric precautions including but not limited to vaginal bleeding, contractions, leaking of fluid and fetal movement were reviewed in detail with the patient.  4. Hemorrhoid OTC Tucks and  anusol  Please refer to After Visit Summary for other counseling recommendations.  Return in 1 week (on 10/09/2014) for OB visit and NST, HRC, NST only in 4 days.   Reva Bores, MD

## 2014-10-02 NOTE — Patient Instructions (Signed)
Third Trimester of Pregnancy The third trimester is from week 29 through week 42, months 7 through 9. The third trimester is a time when the fetus is growing rapidly. At the end of the ninth month, the fetus is about 20 inches in length and weighs 6-10 pounds.  BODY CHANGES Your body goes through many changes during pregnancy. The changes vary from woman to woman.   Your weight will continue to increase. You can expect to gain 25-35 pounds (11-16 kg) by the end of the pregnancy.  You may begin to get stretch marks on your hips, abdomen, and breasts.  You may urinate more often because the fetus is moving lower into your pelvis and pressing on your bladder.  You may develop or continue to have heartburn as a result of your pregnancy.  You may develop constipation because certain hormones are causing the muscles that push waste through your intestines to slow down.  You may develop hemorrhoids or swollen, bulging veins (varicose veins).  You may have pelvic pain because of the weight gain and pregnancy hormones relaxing your joints between the bones in your pelvis. Backaches may result from overexertion of the muscles supporting your posture.  You may have changes in your hair. These can include thickening of your hair, rapid growth, and changes in texture. Some women also have hair loss during or after pregnancy, or hair that feels dry or thin. Your hair will most likely return to normal after your baby is born.  Your breasts will continue to grow and be tender. A yellow discharge may leak from your breasts called colostrum.  Your belly button may stick out.  You may feel short of breath because of your expanding uterus.  You may notice the fetus "dropping," or moving lower in your abdomen.  You may have a bloody mucus discharge. This usually occurs a few days to a week before labor begins.  Your cervix becomes thin and soft (effaced) near your due date. WHAT TO EXPECT AT YOUR  PRENATAL EXAMS  You will have prenatal exams every 2 weeks until week 36. Then, you will have weekly prenatal exams. During a routine prenatal visit:  You will be weighed to make sure you and the fetus are growing normally.  Your blood pressure is taken.  Your abdomen will be measured to track your baby's growth.  The fetal heartbeat will be listened to.  Any test results from the previous visit will be discussed.  You may have a cervical check near your due date to see if you have effaced. At around 36 weeks, your caregiver will check your cervix. At the same time, your caregiver will also perform a test on the secretions of the vaginal tissue. This test is to determine if a type of bacteria, Group B streptococcus, is present. Your caregiver will explain this further. Your caregiver may ask you:  What your birth plan is.  How you are feeling.  If you are feeling the baby move.  If you have had any abnormal symptoms, such as leaking fluid, bleeding, severe headaches, or abdominal cramping.  If you have any questions. Other tests or screenings that may be performed during your third trimester include:  Blood tests that check for low iron levels (anemia).  Fetal testing to check the health, activity level, and growth of the fetus. Testing is done if you have certain medical conditions or if there are problems during the pregnancy. FALSE LABOR You may feel small, irregular contractions that   eventually go away. These are called Braxton Hicks contractions, or false labor. Contractions may last for hours, days, or even weeks before true labor sets in. If contractions come at regular intervals, intensify, or become painful, it is best to be seen by your caregiver.  SIGNS OF LABOR   Menstrual-like cramps.  Contractions that are 5 minutes apart or less.  Contractions that start on the top of the uterus and spread down to the lower abdomen and back.  A sense of increased pelvic  pressure or back pain.  A watery or bloody mucus discharge that comes from the vagina. If you have any of these signs before the 37th week of pregnancy, call your caregiver right away. You need to go to the hospital to get checked immediately. HOME CARE INSTRUCTIONS   Avoid all smoking, herbs, alcohol, and unprescribed drugs. These chemicals affect the formation and growth of the baby.  Follow your caregiver's instructions regarding medicine use. There are medicines that are either safe or unsafe to take during pregnancy.  Exercise only as directed by your caregiver. Experiencing uterine cramps is a good sign to stop exercising.  Continue to eat regular, healthy meals.  Wear a good support bra for breast tenderness.  Do not use hot tubs, steam rooms, or saunas.  Wear your seat belt at all times when driving.  Avoid raw meat, uncooked cheese, cat litter boxes, and soil used by cats. These carry germs that can cause birth defects in the baby.  Take your prenatal vitamins.  Try taking a stool softener (if your caregiver approves) if you develop constipation. Eat more high-fiber foods, such as fresh vegetables or fruit and whole grains. Drink plenty of fluids to keep your urine clear or pale yellow.  Take warm sitz baths to soothe any pain or discomfort caused by hemorrhoids. Use hemorrhoid cream if your caregiver approves.  If you develop varicose veins, wear support hose. Elevate your feet for 15 minutes, 3-4 times a day. Limit salt in your diet.  Avoid heavy lifting, wear low heal shoes, and practice good posture.  Rest a lot with your legs elevated if you have leg cramps or low back pain.  Visit your dentist if you have not gone during your pregnancy. Use a soft toothbrush to brush your teeth and be gentle when you floss.  A sexual relationship may be continued unless your caregiver directs you otherwise.  Do not travel far distances unless it is absolutely necessary and only  with the approval of your caregiver.  Take prenatal classes to understand, practice, and ask questions about the labor and delivery.  Make a trial run to the hospital.  Pack your hospital bag.  Prepare the baby's nursery.  Continue to go to all your prenatal visits as directed by your caregiver. SEEK MEDICAL CARE IF:  You are unsure if you are in labor or if your water has broken.  You have dizziness.  You have mild pelvic cramps, pelvic pressure, or nagging pain in your abdominal area.  You have persistent nausea, vomiting, or diarrhea.  You have a bad smelling vaginal discharge.  You have pain with urination. SEEK IMMEDIATE MEDICAL CARE IF:   You have a fever.  You are leaking fluid from your vagina.  You have spotting or bleeding from your vagina.  You have severe abdominal cramping or pain.  You have rapid weight loss or gain.  You have shortness of breath with chest pain.  You notice sudden or extreme swelling   of your face, hands, ankles, feet, or legs.  You have not felt your baby move in over an hour.  You have severe headaches that do not go away with medicine.  You have vision changes. Document Released: 01/21/2001 Document Revised: 02/01/2013 Document Reviewed: 03/30/2012 [employer] Patient Information 2015 [employer]. This information is not intended to replace advice given to you by your health care provider. Make sure you discuss any questions you have with your health care provider.  Breastfeeding Deciding to breastfeed is one of the best choices you can make for you and your baby. A change in hormones during pregnancy causes your breast tissue to grow and increases the number and size of your milk ducts. These hormones also allow proteins, sugars, and fats from your blood supply to make breast milk in your milk-producing glands. Hormones prevent breast milk from being released before your baby is born as well as prompt milk flow after birth. Once  breastfeeding has begun, thoughts of your baby, as well as his or her sucking or crying, can stimulate the release of milk from your milk-producing glands.  BENEFITS OF BREASTFEEDING For Your Baby  Your first milk (colostrum) helps your baby's digestive system function better.   There are antibodies in your milk that help your baby fight off infections.   Your baby has a lower incidence of asthma, allergies, and sudden infant death syndrome.   The nutrients in breast milk are better for your baby than infant formulas and are designed uniquely for your baby's needs.   Breast milk improves your baby's brain development.   Your baby is less likely to develop other conditions, such as childhood obesity, asthma, or type 2 diabetes mellitus.  For You   Breastfeeding helps to create a very special bond between you and your baby.   Breastfeeding is convenient. Breast milk is always available at the correct temperature and costs nothing.   Breastfeeding helps to burn calories and helps you lose the weight gained during pregnancy.   Breastfeeding makes your uterus contract to its prepregnancy size faster and slows bleeding (lochia) after you give birth.   Breastfeeding helps to lower your risk of developing type 2 diabetes mellitus, osteoporosis, and breast or ovarian cancer later in life. SIGNS THAT YOUR BABY IS HUNGRY Early Signs of Hunger  Increased alertness or activity.  Stretching.  Movement of the head from side to side.  Movement of the head and opening of the mouth when the corner of the mouth or cheek is stroked (rooting).  Increased sucking sounds, smacking lips, cooing, sighing, or squeaking.  Hand-to-mouth movements.  Increased sucking of fingers or hands. Late Signs of Hunger  Fussing.  Intermittent crying. Extreme Signs of Hunger Signs of extreme hunger will require calming and consoling before your baby will be able to breastfeed successfully. Do not  wait for the following signs of extreme hunger to occur before you initiate breastfeeding:   Restlessness.  A loud, strong cry.   Screaming. BREASTFEEDING BASICS Breastfeeding Initiation  Find a comfortable place to sit or lie down, with your neck and back well supported.  Place a pillow or rolled up blanket under your baby to bring him or her to the level of your breast (if you are seated). Nursing pillows are specially designed to help support your arms and your baby while you breastfeed.  Make sure that your baby's abdomen is facing your abdomen.   Gently massage your breast. With your fingertips, massage from your chest   wall toward your nipple in a circular motion. This encourages milk flow. You may need to continue this action during the feeding if your milk flows slowly.  Support your breast with 4 fingers underneath and your thumb above your nipple. Make sure your fingers are well away from your nipple and your baby's mouth.   Stroke your baby's lips gently with your finger or nipple.   When your baby's mouth is open wide enough, quickly bring your baby to your breast, placing your entire nipple and as much of the colored area around your nipple (areola) as possible into your baby's mouth.   More areola should be visible above your baby's upper lip than below the lower lip.   Your baby's tongue should be between his or her lower gum and your breast.   Ensure that your baby's mouth is correctly positioned around your nipple (latched). Your baby's lips should create a seal on your breast and be turned out (everted).  It is common for your baby to suck about 2-3 minutes in order to start the flow of breast milk. Latching Teaching your baby how to latch on to your breast properly is very important. An improper latch can cause nipple pain and decreased milk supply for you and poor weight gain in your baby. Also, if your baby is not latched onto your nipple properly, he or she  may swallow some air during feeding. This can make your baby fussy. Burping your baby when you switch breasts during the feeding can help to get rid of the air. However, teaching your baby to latch on properly is still the best way to prevent fussiness from swallowing air while breastfeeding. Signs that your baby has successfully latched on to your nipple:    Silent tugging or silent sucking, without causing you pain.   Swallowing heard between every 3-4 sucks.    Muscle movement above and in front of his or her ears while sucking.  Signs that your baby has not successfully latched on to nipple:   Sucking sounds or smacking sounds from your baby while breastfeeding.  Nipple pain. If you think your baby has not latched on correctly, slip your finger into the corner of your baby's mouth to break the suction and place it between your baby's gums. Attempt breastfeeding initiation again. Signs of Successful Breastfeeding Signs from your baby:   A gradual decrease in the number of sucks or complete cessation of sucking.   Falling asleep.   Relaxation of his or her body.   Retention of a small amount of milk in his or her mouth.   Letting go of your breast by himself or herself. Signs from you:  Breasts that have increased in firmness, weight, and size 1-3 hours after feeding.   Breasts that are softer immediately after breastfeeding.  Increased milk volume, as well as a change in milk consistency and color by the fifth day of breastfeeding.   Nipples that are not sore, cracked, or bleeding. Signs That Your Baby is Getting Enough Milk  Wetting at least 3 diapers in a 24-hour period. The urine should be clear and pale yellow by age 5 days.  At least 3 stools in a 24-hour period by age 5 days. The stool should be soft and yellow.  At least 3 stools in a 24-hour period by age 7 days. The stool should be seedy and yellow.  No loss of weight greater than 10% of birth weight  during the first 3   days of age.  Average weight gain of 4-7 ounces (113-198 g) per week after age 4 days.  Consistent daily weight gain by age 5 days, without weight loss after the age of 2 weeks. After a feeding, your baby may spit up a small amount. This is common. BREASTFEEDING FREQUENCY AND DURATION Frequent feeding will help you make more milk and can prevent sore nipples and breast engorgement. Breastfeed when you feel the need to reduce the fullness of your breasts or when your baby shows signs of hunger. This is called "breastfeeding on demand." Avoid introducing a pacifier to your baby while you are working to establish breastfeeding (the first 4-6 weeks after your baby is born). After this time you may choose to use a pacifier. Research has shown that pacifier use during the first year of a baby's life decreases the risk of sudden infant death syndrome (SIDS). Allow your baby to feed on each breast as long as he or she wants. Breastfeed until your baby is finished feeding. When your baby unlatches or falls asleep while feeding from the first breast, offer the second breast. Because newborns are often sleepy in the first few weeks of life, you may need to awaken your baby to get him or her to feed. Breastfeeding times will vary from baby to baby. However, the following rules can serve as a guide to help you ensure that your baby is properly fed:  Newborns (babies 4 weeks of age or younger) may breastfeed every 1-3 hours.  Newborns should not go longer than 3 hours during the day or 5 hours during the night without breastfeeding.  You should breastfeed your baby a minimum of 8 times in a 24-hour period until you begin to introduce solid foods to your baby at around 6 months of age. BREAST MILK PUMPING Pumping and storing breast milk allows you to ensure that your baby is exclusively fed your breast milk, even at times when you are unable to breastfeed. This is especially important if you are  going back to work while you are still breastfeeding or when you are not able to be present during feedings. Your lactation consultant can give you guidelines on how long it is safe to store breast milk.  A breast pump is a machine that allows you to pump milk from your breast into a sterile bottle. The pumped breast milk can then be stored in a refrigerator or freezer. Some breast pumps are operated by hand, while others use electricity. Ask your lactation consultant which type will work best for you. Breast pumps can be purchased, but some hospitals and breastfeeding support groups lease breast pumps on a monthly basis. A lactation consultant can teach you how to hand express breast milk, if you prefer not to use a pump.  CARING FOR YOUR BREASTS WHILE YOU BREASTFEED Nipples can become dry, cracked, and sore while breastfeeding. The following recommendations can help keep your breasts moisturized and healthy:  Avoid using soap on your nipples.   Wear a supportive bra. Although not required, special nursing bras and tank tops are designed to allow access to your breasts for breastfeeding without taking off your entire bra or top. Avoid wearing underwire-style bras or extremely tight bras.  Air dry your nipples for 3-4minutes after each feeding.   Use only cotton bra pads to absorb leaked breast milk. Leaking of breast milk between feedings is normal.   Use lanolin on your nipples after breastfeeding. Lanolin helps to maintain your skin's   normal moisture barrier. If you use pure lanolin, you do not need to wash it off before feeding your baby again. Pure lanolin is not toxic to your baby. You may also hand express a few drops of breast milk and gently massage that milk into your nipples and allow the milk to air dry. In the first few weeks after giving birth, some women experience extremely full breasts (engorgement). Engorgement can make your breasts feel heavy, warm, and tender to the touch.  Engorgement peaks within 3-5 days after you give birth. The following recommendations can help ease engorgement:  Completely empty your breasts while breastfeeding or pumping. You may want to start by applying warm, moist heat (in the shower or with warm water-soaked hand towels) just before feeding or pumping. This increases circulation and helps the milk flow. If your baby does not completely empty your breasts while breastfeeding, pump any extra milk after he or she is finished.  Wear a snug bra (nursing or regular) or tank top for 1-2 days to signal your body to slightly decrease milk production.  Apply ice packs to your breasts, unless this is too uncomfortable for you.  Make sure that your baby is latched on and positioned properly while breastfeeding. If engorgement persists after 48 hours of following these recommendations, contact your health care provider or a lactation consultant. OVERALL HEALTH CARE RECOMMENDATIONS WHILE BREASTFEEDING  Eat healthy foods. Alternate between meals and snacks, eating 3 of each per day. Because what you eat affects your breast milk, some of the foods may make your baby more irritable than usual. Avoid eating these foods if you are sure that they are negatively affecting your baby.  Drink milk, fruit juice, and water to satisfy your thirst (about 10 glasses a day).   Rest often, relax, and continue to take your prenatal vitamins to prevent fatigue, stress, and anemia.  Continue breast self-awareness checks.  Avoid chewing and smoking tobacco.  Avoid alcohol and drug use. Some medicines that may be harmful to your baby can pass through breast milk. It is important to ask your health care provider before taking any medicine, including all over-the-counter and prescription medicine as well as vitamin and herbal supplements. It is possible to become pregnant while breastfeeding. If birth control is desired, ask your health care provider about options that  will be safe for your baby. SEEK MEDICAL CARE IF:   You feel like you want to stop breastfeeding or have become frustrated with breastfeeding.  You have painful breasts or nipples.  Your nipples are cracked or bleeding.  Your breasts are red, tender, or warm.  You have a swollen area on either breast.  You have a fever or chills.  You have nausea or vomiting.  You have drainage other than breast milk from your nipples.  Your breasts do not become full before feedings by the fifth day after you give birth.  You feel sad and depressed.  Your baby is too sleepy to eat well.  Your baby is having trouble sleeping.   Your baby is wetting less than 3 diapers in a 24-hour period.  Your baby has less than 3 stools in a 24-hour period.  Your baby's skin or the white part of his or her eyes becomes yellow.   Your baby is not gaining weight by 5 days of age. SEEK IMMEDIATE MEDICAL CARE IF:   Your baby is overly tired (lethargic) and does not want to wake up and feed.  Your baby   develops an unexplained fever. Document Released: 01/27/2005 Document Revised: 02/01/2013 Document Reviewed: 07/21/2012 [employer] Patient Information 2015 [employer]. This information is not intended to replace advice given to you by your health care provider. Make sure you discuss any questions you have with your health care provider.  

## 2014-10-02 NOTE — Progress Notes (Signed)
Feryal used for interpreter IOL scheduled 8/30

## 2014-10-04 ENCOUNTER — Encounter (HOSPITAL_COMMUNITY): Payer: Self-pay | Admitting: *Deleted

## 2014-10-04 ENCOUNTER — Telehealth (HOSPITAL_COMMUNITY): Payer: Self-pay | Admitting: *Deleted

## 2014-10-04 NOTE — Telephone Encounter (Signed)
Preadmission screen Interpreter number [id number] 

## 2014-10-05 ENCOUNTER — Ambulatory Visit (INDEPENDENT_AMBULATORY_CARE_PROVIDER_SITE_OTHER): Payer: Self-pay | Admitting: *Deleted

## 2014-10-05 VITALS — BP 113/70 | HR 87

## 2014-10-05 DIAGNOSIS — O24419 Gestational diabetes mellitus in pregnancy, unspecified control: Secondary | ICD-10-CM

## 2014-10-05 NOTE — Progress Notes (Signed)
Interpreter present for encounter NST  FOB would like to discontinue NST/AFI on Monday 10/09/14, the day before induction.  FOB states that they have no insurance, and the ultrasounds and testing are expensive and to date all has been well with the baby. Explained to FOB and patient, through interpreter, that the testing is recommended until delivery for patients with diabetes in pregnancy.  We are monitoring fetal well being, placenta function, and fluid level around the baby.  The only way we can be reassured with fetal well being is through testing.  FOB states that they are coming Monday to see the Doctor and listen to the baby's heart beat. Offered to patient and FOB to see provider before testing to discuss the need and importance of the testing.  FOB agreed.  Time to see provider changed to 0945. FOB verbalizes understanding.

## 2014-10-05 NOTE — Progress Notes (Signed)
NST reactive.

## 2014-10-09 ENCOUNTER — Encounter: Payer: Self-pay | Admitting: Obstetrics and Gynecology

## 2014-10-09 ENCOUNTER — Ambulatory Visit (INDEPENDENT_AMBULATORY_CARE_PROVIDER_SITE_OTHER): Payer: Self-pay | Admitting: Family Medicine

## 2014-10-09 VITALS — BP 117/77 | HR 83 | Temp 98.1°F | Wt 187.0 lb

## 2014-10-09 DIAGNOSIS — O24419 Gestational diabetes mellitus in pregnancy, unspecified control: Secondary | ICD-10-CM

## 2014-10-09 DIAGNOSIS — O0993 Supervision of high risk pregnancy, unspecified, third trimester: Secondary | ICD-10-CM

## 2014-10-09 DIAGNOSIS — K5901 Slow transit constipation: Secondary | ICD-10-CM

## 2014-10-09 DIAGNOSIS — O0933 Supervision of pregnancy with insufficient antenatal care, third trimester: Secondary | ICD-10-CM

## 2014-10-09 LAB — POCT URINALYSIS DIP (DEVICE)
Bilirubin Urine: NEGATIVE
Glucose, UA: NEGATIVE mg/dL
HGB URINE DIPSTICK: NEGATIVE
Ketones, ur: NEGATIVE mg/dL
Leukocytes, UA: NEGATIVE
NITRITE: NEGATIVE
PH: 6 (ref 5.0–8.0)
PROTEIN: NEGATIVE mg/dL
Specific Gravity, Urine: 1.025 (ref 1.005–1.030)
Urobilinogen, UA: 0.2 mg/dL (ref 0.0–1.0)

## 2014-10-09 MED ORDER — POLYETHYLENE GLYCOL 3350 17 G PO PACK: 17.0000 g | PACK | Freq: Three times a day (TID) | ORAL | Status: AC

## 2014-10-09 NOTE — Progress Notes (Signed)
Subjective:  Brandy Burnett is a 29 y.o. G2P0010 at [redacted]w[redacted]d being seen today for ongoing prenatal care.  Patient reports no complaints.  Contractions: Irritability.  Vag. Bleeding: None. Movement: Present. Denies leaking of fluid.   Concerned about cost of testing.   The following portions of the patient's history were reviewed and updated as appropriate: allergies, current medications, past family history, past medical history, past social history, past surgical history and problem list.   Objective:   Filed Vitals:   10/09/14 0956  BP: 117/77  Pulse: 83  Temp: 98.1 F (36.7 C)  Weight: 187 lb (84.823 kg)    Fetal Status: Fetal Heart Rate (bpm): 145   Movement: Present     General:  Alert, oriented and cooperative. Patient is in no acute distress.  Skin: Skin is warm and dry. No rash noted.   Cardiovascular: Normal heart rate noted  Respiratory: Normal respiratory effort, no problems with respiration noted  Abdomen: Soft, gravid, appropriate for gestational age. Pain/Pressure: Present     Pelvic: Vag. Bleeding: None     Cervical exam deferred        Extremities: Normal range of motion.  Edema: Trace  Mental Status: Normal mood and affect. Normal behavior. Normal judgment and thought content.   Urinalysis: Urine Protein: Negative Urine Glucose: Negative Fasting 94, 87, 93, 85, 79, 76, 107 PP 127, 123, 105, 102, 103,92 99, 106, 126, 95, 119 99 126,120,115,128, 128 Assessment and Plan:  Pregnancy: G2P0010 at [redacted]w[redacted]d  1. Gestational diabetes mellitus, class A2 IOL scheduled for tomorrow 8/30. Husband, Patient and I had a lengthy discussion about need for NST/AFI as part of routine antenatal testing for diabetes in pregnancy. The patient and husband are currently paying out of pocket and concerned about cost. They had NST/AFI on Thurs (8/25) which was reactive and within normal limits respectively.  I discussed that our practice guidelines as well as national guidelines support  increased monitoring due to diabetes. I discussed that these testing guidelines are to prevent stillbirth which the patient is at higher risk for given her GDM. Patient and husband voiced understanding (through use of interpreter) that the risk of foregoing antenatal screening could lead to an outcome such as a still birth.  The patient brought her book to review and she is having overall very good glycemic control, feels regular fetal movement, and FHR here is wnl. I discussed that I cannot say for sure that they will not experience a still birth but the clinical indications and the proximity of induction are reassuring. I discussed at length fetal kick counts and the need to return prior to IOL for decreased fetal movement.  I discussed that IOL especially for first babies can last several days. Patient and husband voiced understanding. I discussed method of induction (likely cytotec then FB based on cervical exam today). They again voiced understanding.   2. Supervision of high-risk pregnancy, third trimester Updated box Patient had many questions about vaginal recuperation after delivery and desire for things "to be the same size" Additionally she asked whether we "cut to let the baby out"  3. Limited prenatal care in third trimester [place] country and did not received most of PNC here.   Term labor symptoms and general obstetric precautions including but not limited to vaginal bleeding, contractions, leaking of fluid and fetal movement were reviewed in detail with the patient. Please refer to After Visit Summary for other counseling recommendations.  Return in about 6 weeks (around 11/20/2014) for postpartum care.  Future Appointments Date Time Provider Department Center  10/10/2014 7:00 AM [facility] None  11/13/2014 1:10 PM Marny Lowenstein, PA-C [facility]    Federico Flake, MD

## 2014-10-09 NOTE — Patient Instructions (Addendum)
Mirlax (polyethylene glycol) Take this three times today  Fetal Movement Counts Patient Name: __________________________________________________ Patient Due Date: ____________________ Performing a fetal movement count is highly recommended in high-risk pregnancies, but it is good for every pregnant woman to do. Your health care provider may ask you to start counting fetal movements at 28 weeks of the pregnancy. Fetal movements often increase:  After eating a full meal.  After physical activity.  After eating or drinking something sweet or cold.  At rest. Pay attention to when you feel the baby is most active. This will help you notice a pattern of your baby's sleep and wake cycles and what factors contribute to an increase in fetal movement. It is important to perform a fetal movement count at the same time each day when your baby is normally most active.  HOW TO COUNT FETAL MOVEMENTS  Find a quiet and comfortable area to sit or lie down on your left side. Lying on your left side provides the best blood and oxygen circulation to your baby.  Write down the day and time on a sheet of paper or in a journal.  Start counting kicks, flutters, swishes, rolls, or jabs in a 2-hour period. You should feel at least 10 movements within 2 hours.  If you do not feel 10 movements in 2 hours, wait 2-3 hours and count again. Look for a change in the pattern or not enough counts in 2 hours. SEEK MEDICAL CARE IF:  You feel less than 10 counts in 2 hours, tried twice.  There is no movement in over an hour.  The pattern is changing or taking longer each day to reach 10 counts in 2 hours.  You feel the baby is not moving as he or she usually does. Date: ____________ Movements: ____________ Start time: ____________ Doreatha Martin time: ____________  Date: ____________ Movements: ____________ Start time: ____________ Doreatha Martin time: ____________ Date: ____________ Movements: ____________ Start time: ____________  Doreatha Martin time: ____________ Date: ____________ Movements: ____________ Start time: ____________ Doreatha Martin time: ____________ Date: ____________ Movements: ____________ Start time: ____________ Doreatha Martin time: ____________ Date: ____________ Movements: ____________ Start time: ____________ Doreatha Martin time: ____________ Date: ____________ Movements: ____________ Start time: ____________ Doreatha Martin time: ____________ Date: ____________ Movements: ____________ Start time: ____________ Doreatha Martin time: ____________  Date: ____________ Movements: ____________ Start time: ____________ Doreatha Martin time: ____________ Date: ____________ Movements: ____________ Start time: ____________ Doreatha Martin time: ____________ Date: ____________ Movements: ____________ Start time: ____________ Doreatha Martin time: ____________ Date: ____________ Movements: ____________ Start time: ____________ Doreatha Martin time: ____________ Date: ____________ Movements: ____________ Start time: ____________ Doreatha Martin time: ____________ Date: ____________ Movements: ____________ Start time: ____________ Doreatha Martin time: ____________ Date: ____________ Movements: ____________ Start time: ____________ Doreatha Martin time: ____________  Date: ____________ Movements: ____________ Start time: ____________ Doreatha Martin time: ____________ Date: ____________ Movements: ____________ Start time: ____________ Doreatha Martin time: ____________ Date: ____________ Movements: ____________ Start time: ____________ Doreatha Martin time: ____________ Date: ____________ Movements: ____________ Start time: ____________ Doreatha Martin time: ____________ Date: ____________ Movements: ____________ Start time: ____________ Doreatha Martin time: ____________ Date: ____________ Movements: ____________ Start time: ____________ Doreatha Martin time: ____________ Date: ____________ Movements: ____________ Start time: ____________ Doreatha Martin time: ____________  Date: ____________ Movements: ____________ Start time: ____________ Doreatha Martin time: ____________ Date: ____________  Movements: ____________ Start time: ____________ Doreatha Martin time: ____________ Date: ____________ Movements: ____________ Start time: ____________ Doreatha Martin time: ____________ Date: ____________ Movements: ____________ Start time: ____________ Doreatha Martin time: ____________ Date: ____________ Movements: ____________ Start time: ____________ Doreatha Martin time: ____________ Date: ____________ Movements: ____________ Start time: ____________ Doreatha Martin time: ____________ Date: ____________ Movements: ____________ Start time: ____________ Doreatha Martin time: ____________  Date: ____________ Movements: ____________ Start time: ____________ Doreatha Martin time: ____________ Date: ____________ Movements: ____________ Start time: ____________ Doreatha Martin time: ____________ Date: ____________ Movements: ____________ Start time: ____________ Doreatha Martin time: ____________ Date: ____________ Movements: ____________ Start time: ____________ Doreatha Martin time: ____________ Date: ____________ Movements: ____________ Start time: ____________ Doreatha Martin time: ____________ Date: ____________ Movements: ____________ Start time: ____________ Doreatha Martin time: ____________ Date: ____________ Movements: ____________ Start time: ____________ Doreatha Martin time: ____________  Date: ____________ Movements: ____________ Start time: ____________ Doreatha Martin time: ____________ Date: ____________ Movements: ____________ Start time: ____________ Doreatha Martin time: ____________ Date: ____________ Movements: ____________ Start time: ____________ Doreatha Martin time: ____________ Date: ____________ Movements: ____________ Start time: ____________ Doreatha Martin time: ____________ Date: ____________ Movements: ____________ Start time: ____________ Doreatha Martin time: ____________ Date: ____________ Movements: ____________ Start time: ____________ Doreatha Martin time: ____________ Date: ____________ Movements: ____________ Start time: ____________ Doreatha Martin time: ____________  Date: ____________ Movements: ____________ Start time:  ____________ Doreatha Martin time: ____________ Date: ____________ Movements: ____________ Start time: ____________ Doreatha Martin time: ____________ Date: ____________ Movements: ____________ Start time: ____________ Doreatha Martin time: ____________ Date: ____________ Movements: ____________ Start time: ____________ Doreatha Martin time: ____________ Date: ____________ Movements: ____________ Start time: ____________ Doreatha Martin time: ____________ Date: ____________ Movements: ____________ Start time: ____________ Doreatha Martin time: ____________ Date: ____________ Movements: ____________ Start time: ____________ Doreatha Martin time: ____________  Date: ____________ Movements: ____________ Start time: ____________ Doreatha Martin time: ____________ Date: ____________ Movements: ____________ Start time: ____________ Doreatha Martin time: ____________ Date: ____________ Movements: ____________ Start time: ____________ Doreatha Martin time: ____________ Date: ____________ Movements: ____________ Start time: ____________ Doreatha Martin time: ____________ Date: ____________ Movements: ____________ Start time: ____________ Doreatha Martin time: ____________ Date: ____________ Movements: ____________ Start time: ____________ Doreatha Martin time: ____________ Document Released: 02/26/2006 Document Revised: 06/13/2013 Document Reviewed: 11/24/2011 [employer] Patient Information 2015 [employer]. This information is not intended to replace advice given to you by your health care provider. Make sure you discuss any questions you have with your health care provider.  Third Trimester of Pregnancy The third trimester is from week 29 through week 42, months 7 through 9. The third trimester is a time when the fetus is growing rapidly. At the end of the ninth month, the fetus is about 20 inches in length and weighs 6-10 pounds.  BODY CHANGES Your body goes through many changes during pregnancy. The changes vary from woman to woman.   Your weight will continue to increase. You can expect to gain 25-35 pounds  (11-16 kg) by the end of the pregnancy.  You may begin to get stretch marks on your hips, abdomen, and breasts.  You may urinate more often because the fetus is moving lower into your pelvis and pressing on your bladder.  You may develop or continue to have heartburn as a result of your pregnancy.  You may develop constipation because certain hormones are causing the muscles that push waste through your intestines to slow down.  You may develop hemorrhoids or swollen, bulging veins (varicose veins).  You may have pelvic pain because of the weight gain and pregnancy hormones relaxing your joints between the bones in your pelvis. Backaches may result from overexertion of the muscles supporting your posture.  You may have changes in your hair. These can include thickening of your hair, rapid growth, and changes in texture. Some women also have hair loss during or after pregnancy, or hair that feels dry or thin. Your hair will most likely return to normal after your baby is born.  Your breasts will continue to grow and be tender. A yellow discharge may leak from your breasts called colostrum.  Your belly button  may stick out.  You may feel short of breath because of your expanding uterus.  You may notice the fetus "dropping," or moving lower in your abdomen.  You may have a bloody mucus discharge. This usually occurs a few days to a week before labor begins.  Your cervix becomes thin and soft (effaced) near your due date. WHAT TO EXPECT AT YOUR PRENATAL EXAMS  You will have prenatal exams every 2 weeks until week 36. Then, you will have weekly prenatal exams. During a routine prenatal visit:  You will be weighed to make sure you and the fetus are growing normally.  Your blood pressure is taken.  Your abdomen will be measured to track your baby's growth.  The fetal heartbeat will be listened to.  Any test results from the previous visit will be discussed.  You may have a cervical  check near your due date to see if you have effaced. At around 36 weeks, your caregiver will check your cervix. At the same time, your caregiver will also perform a test on the secretions of the vaginal tissue. This test is to determine if a type of bacteria, Group B streptococcus, is present. Your caregiver will explain this further. Your caregiver may ask you:  What your birth plan is.  How you are feeling.  If you are feeling the baby move.  If you have had any abnormal symptoms, such as leaking fluid, bleeding, severe headaches, or abdominal cramping.  If you have any questions. Other tests or screenings that may be performed during your third trimester include:  Blood tests that check for low iron levels (anemia).  Fetal testing to check the health, activity level, and growth of the fetus. Testing is done if you have certain medical conditions or if there are problems during the pregnancy. FALSE LABOR You may feel small, irregular contractions that eventually go away. These are called Braxton Hicks contractions, or false labor. Contractions may last for hours, days, or even weeks before true labor sets in. If contractions come at regular intervals, intensify, or become painful, it is best to be seen by your caregiver.  SIGNS OF LABOR   Menstrual-like cramps.  Contractions that are 5 minutes apart or less.  Contractions that start on the top of the uterus and spread down to the lower abdomen and back.  A sense of increased pelvic pressure or back pain.  A watery or bloody mucus discharge that comes from the vagina. If you have any of these signs before the 37th week of pregnancy, call your caregiver right away. You need to go to the hospital to get checked immediately. HOME CARE INSTRUCTIONS   Avoid all smoking, herbs, alcohol, and unprescribed drugs. These chemicals affect the formation and growth of the baby.  Follow your caregiver's instructions regarding medicine use.  There are medicines that are either safe or unsafe to take during pregnancy.  Exercise only as directed by your caregiver. Experiencing uterine cramps is a good sign to stop exercising.  Continue to eat regular, healthy meals.  Wear a good support bra for breast tenderness.  Do not use hot tubs, steam rooms, or saunas.  Wear your seat belt at all times when driving.  Avoid raw meat, uncooked cheese, cat litter boxes, and soil used by cats. These carry germs that can cause birth defects in the baby.  Take your prenatal vitamins.  Try taking a stool softener (if your caregiver approves) if you develop constipation. Eat more high-fiber foods, such  as fresh vegetables or fruit and whole grains. Drink plenty of fluids to keep your urine clear or pale yellow.  Take warm sitz baths to soothe any pain or discomfort caused by hemorrhoids. Use hemorrhoid cream if your caregiver approves.  If you develop varicose veins, wear support hose. Elevate your feet for 15 minutes, 3-4 times a day. Limit salt in your diet.  Avoid heavy lifting, wear low heal shoes, and practice good posture.  Rest a lot with your legs elevated if you have leg cramps or low back pain.  Visit your dentist if you have not gone during your pregnancy. Use a soft toothbrush to brush your teeth and be gentle when you floss.  A sexual relationship may be continued unless your caregiver directs you otherwise.  Do not travel far distances unless it is absolutely necessary and only with the approval of your caregiver.  Take prenatal classes to understand, practice, and ask questions about the labor and delivery.  Make a trial run to the hospital.  Pack your hospital bag.  Prepare the baby's nursery.  Continue to go to all your prenatal visits as directed by your caregiver. SEEK MEDICAL CARE IF:  You are unsure if you are in labor or if your water has broken.  You have dizziness.  You have mild pelvic cramps, pelvic  pressure, or nagging pain in your abdominal area.  You have persistent nausea, vomiting, or diarrhea.  You have a bad smelling vaginal discharge.  You have pain with urination. SEEK IMMEDIATE MEDICAL CARE IF:   You have a fever.  You are leaking fluid from your vagina.  You have spotting or bleeding from your vagina.  You have severe abdominal cramping or pain.  You have rapid weight loss or gain.  You have shortness of breath with chest pain.  You notice sudden or extreme swelling of your face, hands, ankles, feet, or legs.  You have not felt your baby move in over an hour.  You have severe headaches that do not go away with medicine.  You have vision changes. Document Released: 01/21/2001 Document Revised: 02/01/2013 Document Reviewed: 03/30/2012 [facility] Patient Information 2015 [employer], [place]. This information is not intended to replace advice given to you by your health care provider. Make sure you discuss any questions you have with your health care provider.

## 2014-10-09 NOTE — Progress Notes (Signed)
Breastfeeding tip of the week reviewed Interpreter present for encounter 

## 2014-10-10 ENCOUNTER — Encounter (HOSPITAL_COMMUNITY): Payer: Self-pay

## 2014-10-10 ENCOUNTER — Inpatient Hospital Stay (HOSPITAL_COMMUNITY)
Admission: RE | Admit: 2014-10-10 | Discharge: 2014-10-12 | DRG: 775 | Disposition: A | Discharge status: Home / Self Care | Payer: Self-pay | Source: Ambulatory Visit | Attending: Obstetrics & Gynecology | Admitting: Obstetrics & Gynecology

## 2014-10-10 VITALS — BP 118/72 | HR 68 | Temp 98.2°F | Resp 18 | Ht 63.0 in | Wt 187.0 lb

## 2014-10-10 DIAGNOSIS — Z8249 Family history of ischemic heart disease and other diseases of the circulatory system: Secondary | ICD-10-CM

## 2014-10-10 DIAGNOSIS — O0933 Supervision of pregnancy with insufficient antenatal care, third trimester: Secondary | ICD-10-CM

## 2014-10-10 DIAGNOSIS — O24419 Gestational diabetes mellitus in pregnancy, unspecified control: Secondary | ICD-10-CM | POA: Diagnosis present

## 2014-10-10 DIAGNOSIS — Z3A39 39 weeks gestation of pregnancy: Secondary | ICD-10-CM | POA: Diagnosis present

## 2014-10-10 DIAGNOSIS — O24429 Gestational diabetes mellitus in childbirth, unspecified control: Principal | ICD-10-CM | POA: Diagnosis present

## 2014-10-10 DIAGNOSIS — Z79899 Other long term (current) drug therapy: Secondary | ICD-10-CM

## 2014-10-10 LAB — GLUCOSE, CAPILLARY
GLUCOSE-CAPILLARY: 81 mg/dL (ref 65–99)
Glucose-Capillary: 84 mg/dL (ref 65–99)
Glucose-Capillary: 61 mg/dL — ABNORMAL LOW (ref 65–99)
Glucose-Capillary: 75 mg/dL (ref 65–99)

## 2014-10-10 LAB — CBC
HCT: 32.7 % — ABNORMAL LOW (ref 36.0–46.0)
Hemoglobin: 10.4 g/dL — ABNORMAL LOW (ref 12.0–15.0)
MCH: 25.4 pg — ABNORMAL LOW (ref 26.0–34.0)
MCHC: 31.8 g/dL (ref 30.0–36.0)
MCV: 80 fL (ref 78.0–100.0)
PLATELETS: 239 10*3/uL (ref 150–400)
RBC: 4.09 MIL/uL (ref 3.87–5.11)
RDW: 16.9 % — AB (ref 11.5–15.5)
WBC: 10.3 10*3/uL (ref 4.0–10.5)

## 2014-10-10 LAB — TYPE AND SCREEN
ABO/RH(D): A POS
ANTIBODY SCREEN: NEGATIVE

## 2014-10-10 LAB — ABO/RH: ABO/RH(D): A POS

## 2014-10-10 LAB — RPR: RPR: NONREACTIVE

## 2014-10-10 MED ORDER — OXYTOCIN BOLUS FROM INFUSION: 500.0000 mL | INTRAVENOUS | Status: DC

## 2014-10-10 MED ORDER — MISOPROSTOL 50MCG HALF TABLET
50.0000 ug | ORAL_TABLET | ORAL | Status: DC | PRN
  Administered 2014-10-10 (×3): 50 ug via ORAL
  Filled 2014-10-10 (×4): qty 0.5

## 2014-10-10 MED ORDER — MISOPROSTOL 25 MCG QUARTER TABLET: 25.0000 ug | ORAL_TABLET | ORAL | Status: DC | PRN

## 2014-10-10 MED ORDER — LACTATED RINGERS IV SOLN
500.0000 mL | INTRAVENOUS | Status: DC | PRN
  Administered 2014-10-11 (×2): 500 mL via INTRAVENOUS

## 2014-10-10 MED ORDER — TERBUTALINE SULFATE 1 MG/ML IJ SOLN
0.2500 mg | Freq: Once | INTRAMUSCULAR | Status: DC | PRN
  Filled 2014-10-10: qty 1

## 2014-10-10 MED ORDER — LACTATED RINGERS IV SOLN
INTRAVENOUS | Status: DC
  Administered 2014-10-10 – 2014-10-11 (×5): via INTRAVENOUS

## 2014-10-10 MED ORDER — ACETAMINOPHEN 325 MG PO TABS: 650.0000 mg | ORAL_TABLET | ORAL | Status: DC | PRN

## 2014-10-10 MED ORDER — OXYTOCIN 40 UNITS IN LACTATED RINGERS INFUSION - SIMPLE MED: 62.5000 mL/h | INTRAVENOUS | Status: DC

## 2014-10-10 MED ORDER — CITRIC ACID-SODIUM CITRATE 334-500 MG/5ML PO SOLN: 30.0000 mL | ORAL | Status: DC | PRN

## 2014-10-10 MED ORDER — ONDANSETRON HCL 4 MG/2ML IJ SOLN
4.0000 mg | Freq: Four times a day (QID) | INTRAMUSCULAR | Status: DC | PRN
  Administered 2014-10-11: 4 mg via INTRAVENOUS
  Filled 2014-10-10: qty 2

## 2014-10-10 MED ORDER — LIDOCAINE HCL (PF) 1 % IJ SOLN
30.0000 mL | INTRAMUSCULAR | Status: DC | PRN
  Filled 2014-10-10: qty 30

## 2014-10-10 MED ORDER — MISOPROSTOL 25 MCG QUARTER TABLET
25.0000 ug | ORAL_TABLET | ORAL | Status: DC | PRN
  Administered 2014-10-10 – 2014-10-11 (×2): 25 ug via VAGINAL
  Filled 2014-10-10: qty 0.25
  Filled 2014-10-10: qty 1
  Filled 2014-10-10: qty 0.25

## 2014-10-10 MED ORDER — OXYTOCIN 40 UNITS IN LACTATED RINGERS INFUSION - SIMPLE MED: 1.0000 m[IU]/min | INTRAVENOUS | Status: DC

## 2014-10-10 NOTE — Progress Notes (Signed)
Labor Progress Note  Elara Cocke is a 29 y.o. G2P0010 at [redacted]w[redacted]d  admitted for induction of labor due to Gestational diabetes.  S: Doing well. Husband interpreting for patient but they voice no concerns.   O:  BP 116/75 mmHg  Pulse 82  Temp(Src) 97.8 F (36.6 C) (Oral)  Resp 18  Ht  (1.6 m)  Wt 187 lb (84.823 kg)  BMI 33.13 kg/m2  LMP 01/10/2014 (LMP Unknown)  FHT:  FHR: 130 bpm, variability: moderate,  accelerations:  Present,  decelerations:  Absent UC:   regular, every 2-4 minutes SVE:   Dilation: 1 Effacement (%): 50 Station: -2 Exam by:: L. Mcdaniel RN  Labs: Lab Results  Component Value Date   WBC 10.3 10/10/2014   HGB 10.4* 10/10/2014   HCT 32.7* 10/10/2014   MCV 80.0 10/10/2014   PLT 239 10/10/2014   CBG (last 3)   Recent Labs  10/10/14 1202 10/10/14 1601 10/10/14 2011  GLUCAP 81 61* 75    Assessment / Plan: 29 y.o. G2P0010 [redacted]w[redacted]d not in labor. Induction of labor due to gestational diabetes  Labor: IOL with cytotec, when able will place FB and AROM Fetal Wellbeing:  Category I Pain Control:  Labor support without medications Anticipated MOD:  NSVD  Expectant management  Of note, some concern about whether husband is interpreting appropriately. Appropriate forms signed.    Caryl Ada, DO 10/10/2014, 9:32 PM PGY-2, [facility]

## 2014-10-10 NOTE — H&P (Signed)
OBSTETRIC ADMISSION HISTORY AND PHYSICAL  Brandy Burnett is a 29 y.o. female G2P0010 with IUP at [redacted]w[redacted]d by L/6 presenting for IOL for A2GDM. She reports +FMs, No LOF, no VB, no blurry vision, headaches or peripheral edema, and RUQ pain.  She plans on breast feeding. She is undecided on birth control.  Dating: By L/6--->  Estimated Date of Delivery: 10/17/14  @34wks3d  --> cephalic presentation, longitudinal lie, 2418 gm, 50%tile EFW @37wks5d  --> cephalic presentation, longitudinal lie, 3445 gm, 83%tile EFW   Clinic [facility] Prenatal Labs  Dating 6 week [place] Blood type: A/POS/-- (07/13 1209) A Pos  Genetic Screen Too late Antibody:NEG (07/13 1209)Neg  Anatomic [place]  Normal female per pt.  Rubella: 10.80 (07/13 1209)Immune  GTT Third trimester: 168 3 hour GTT- 103/280/228/177 RPR: NON REAC (07/13 1209) NR  Flu vaccine 10/02/14 HBsAg: NEGATIVE (07/13 1209) negative  TDaP vaccine  08/23/14                             HIV: NONREACTIVE (07/13 1209) NR  Baby Food Breast                                              GBS: NEGATIVE (For PCN allergy, check sensitivities)  Contraception  unsure Pap: Normal, but CTZ absent  Circumcision NA   Pediatrician  [facility]   Support Person Husband     Prenatal History/Complications:  Past Medical History: Past Medical History  Diagnosis Date  . Gestational diabetes     glyburide    Past Surgical History: Past Surgical History  Procedure Laterality Date  . Laparoscopy      bladder  . Mouth surgery      Obstetrical History: OB History    Gravida Para Term Preterm AB TAB SAB Ectopic Multiple Living   2 0 0 0 1 0 1 0 0 0       Social History: Social History   Social History  . Marital Status: Married    Spouse Name: N/A  . Number of Children: N/A  . Years of Education: N/A   Social History Main Topics  . Smoking status: Never Smoker   . Smokeless tobacco: Never Used  . Alcohol Use: No  . Drug Use: No  . Sexual Activity: Yes   Other Topics  Concern  . None   Social History Narrative    Family History: Family History  Problem Relation Age of Onset  . Cancer Father   . Hypertension Father   . Hypertension Mother     Allergies: No Known Allergies  Prescriptions prior to admission  Medication Sig Dispense Refill Last Dose  . glyBURIDE (DIABETA) 2.5 MG tablet Take 1 tablet (2.5 mg total) by mouth 2 (two) times daily with a meal. Take 1 pill in am and 1/2 pill q hs (Patient taking differently: Take 2.5 mg by mouth daily. Takes 1/2 pill in morning and 1/2 pillat night) 30 tablet 3 10/09/2014  . phenylephrine-shark liver oil-mineral oil-petrolatum (PREPARATION H) 0.25-3-14-71.9 % rectal ointment Place 1 application rectally 2 (two) times daily as needed for hemorrhoids.   10/09/2014  . polyethylene glycol (MIRALAX) packet Take 17 g by mouth 3 (three) times daily. (Patient taking differently: Take 17 g by mouth as needed for mild constipation. ) 14 each 0 10/09/2014  . Prenatal Multivit-Min-Fe-FA (PRENATAL VITAMINS)  0.8 MG tablet Take 1 tablet by mouth daily. 30 tablet 12 10/09/2014     Review of Systems   All systems reviewed and negative except as stated in HPI  Blood pressure 127/80, pulse 69, temperature 98 F (36.7 C), temperature source Oral, resp. rate 20, height  (1.6 m), weight 187 lb (84.823 kg), last menstrual period 01/10/2014, unknown if currently breastfeeding. General appearance: alert, cooperative and no distress; husband is translating for her  Lungs: clear to auscultation bilaterally Heart: regular rate and rhythm Abdomen: soft, non-tender; bowel sounds normal Pelvic: no vaginal bleeding noted; membranes intact Extremities: no sign of DVT Presentation: cephalic Fetal monitoringBaseline: 135 bpm, Variability: Good {> 6 bpm) and Accelerations: Reactive Uterine activityDate/time of onset: 10/10/14, Frequency: Every 3-5 minutes, Duration: 30-40 seconds and Intensity: mild  Dilation: 1 Effacement (%):  50 Station: -3 Exam by:: Lynda Rainwater RN    Prenatal labs: ABO, Rh: --/--/A POS (08/30 0815) Antibody: NEG (08/30 0815) Rubella:  Immune RPR: NON REAC (07/13 1209)  HBsAg: NEGATIVE (07/13 1209)  HIV: NONREACTIVE (07/13 1209)  GBS: Negative (08/08 0000)    Prenatal Transfer Tool  Maternal Diabetes: Yes:  Diabetes Type:  Insulin/Medication controlled Genetic Screening: Normal Maternal Ultrasounds/Referrals: Normal Fetal Ultrasounds or other Referrals:  None Maternal Substance Abuse:  No Significant Maternal Medications:  Meds include: Other: glyburide Significant Maternal Lab Results: None  Results for orders placed or performed during the hospital encounter of 10/10/14 (from the past 24 hour(s))  CBC   Collection Time: 10/10/14  8:15 AM  Result Value Ref Range   WBC 10.3 4.0 - 10.5 K/uL   RBC 4.09 3.87 - 5.11 MIL/uL   Hemoglobin 10.4 (L) 12.0 - 15.0 g/dL   HCT 16.1 (L) 09.6 - 04.5 %   MCV 80.0 78.0 - 100.0 fL   MCH 25.4 (L) 26.0 - 34.0 pg   MCHC 31.8 30.0 - 36.0 g/dL   RDW 40.9 (H) 81.1 - 91.4 %   Platelets 239 150 - 400 K/uL  Glucose, capillary   Collection Time: 10/10/14  8:15 AM  Result Value Ref Range   Glucose-Capillary 84 65 - 99 mg/dL  Type and screen   Collection Time: 10/10/14  8:15 AM  Result Value Ref Range   ABO/RH(D) A POS    Antibody Screen NEG    Sample Expiration 10/13/2014   Glucose, capillary   Collection Time: 10/10/14 12:02 PM  Result Value Ref Range   Glucose-Capillary 81 65 - 99 mg/dL    Patient Active Problem List   Diagnosis Date Noted  . Gestational diabetes 10/10/2014  . Gestational diabetes mellitus, class A2 09/03/2014  . Supervision of high-risk pregnancy 08/23/2014  . Limited prenatal care in third trimester 08/23/2014    Assessment: Brandy Burnett is a 29 y.o. G2P0010 at [redacted]w[redacted]d here for IOL for A2GDM.  #Labor:induction of labor; will place Foley bulb and cytotec, pitocin when in active labor #Pain: IV pain meds PRN;  undecided about epidural #FWB: Category 1 #ID:  GBS neg #MOF: Breast #MOC:Undecided  Tarri Abernethy 10/10/2014, 2:46 PM  OB fellow attestation: I have seen and examined this patient; I agree with above documentation in the resident's note.   Lakindra Wible is a 29 y.o. G2P0010 here for IOL for A2GDM  PE: BP 117/76 mmHg  Pulse 88  Temp(Src) 98.5 F (36.9 C) (Oral)  Resp 18  Ht  (1.6 m)  Wt 187 lb (84.823 kg)  BMI 33.13 kg/m2  LMP 01/10/2014 (LMP Unknown)  Gen: calm comfortable, NAD Resp: normal effort, no distress Abd: gravid  ROS, labs, PMH reviewed  Plan: #Labor:induction of labor; will place Foley bulb and cytotec, pitocin when in active labor #Pain: IV pain meds PRN; undecided about epidural #FWB: Category 1 #ID:  GBS neg #MOF: Breast #MOC:Undecided  Federico Flake, MD Family Medicine, OB Fellow 10/10/2014, 6:17 PM

## 2014-10-10 NOTE — Plan of Care (Signed)
Problem: Consults Goal: Birthing Suites Patient Information Press F2 to bring up selections list   Pt 37-[redacted] weeks EGA     

## 2014-10-11 ENCOUNTER — Inpatient Hospital Stay (HOSPITAL_COMMUNITY): Payer: Self-pay | Admitting: Anesthesiology

## 2014-10-11 ENCOUNTER — Encounter (HOSPITAL_COMMUNITY): Payer: Self-pay

## 2014-10-11 DIAGNOSIS — O24429 Gestational diabetes mellitus in childbirth, unspecified control: Secondary | ICD-10-CM

## 2014-10-11 DIAGNOSIS — Z3A39 39 weeks gestation of pregnancy: Secondary | ICD-10-CM

## 2014-10-11 DIAGNOSIS — Z79899 Other long term (current) drug therapy: Secondary | ICD-10-CM

## 2014-10-11 LAB — GLUCOSE, CAPILLARY
GLUCOSE-CAPILLARY: 98 mg/dL (ref 65–99)
Glucose-Capillary: 78 mg/dL (ref 65–99)
Glucose-Capillary: 86 mg/dL (ref 65–99)
Glucose-Capillary: 77 mg/dL (ref 65–99)
Glucose-Capillary: 92 mg/dL (ref 65–99)

## 2014-10-11 LAB — HIV ANTIBODY (ROUTINE TESTING W REFLEX): HIV Screen 4th Generation wRfx: NONREACTIVE

## 2014-10-11 MED ORDER — BENZOCAINE-MENTHOL 20-0.5 % EX AERO
1.0000 "application " | INHALATION_SPRAY | CUTANEOUS | Status: DC | PRN
  Administered 2014-10-12: 1 via TOPICAL
  Filled 2014-10-11: qty 56

## 2014-10-11 MED ORDER — TERBUTALINE SULFATE 1 MG/ML IJ SOLN
0.2500 mg | Freq: Once | INTRAMUSCULAR | Status: DC | PRN
  Filled 2014-10-11: qty 1

## 2014-10-11 MED ORDER — OXYTOCIN 40 UNITS IN LACTATED RINGERS INFUSION - SIMPLE MED
1.0000 m[IU]/min | INTRAVENOUS | Status: DC
  Administered 2014-10-11: 1 m[IU]/min via INTRAVENOUS
  Filled 2014-10-11: qty 1000

## 2014-10-11 MED ORDER — ONDANSETRON HCL 4 MG/2ML IJ SOLN: 4.0000 mg | INTRAMUSCULAR | Status: DC | PRN

## 2014-10-11 MED ORDER — PRENATAL MULTIVITAMIN CH
1.0000 | ORAL_TABLET | Freq: Every day | ORAL | Status: DC
  Administered 2014-10-12: 1 via ORAL
  Filled 2014-10-11: qty 1

## 2014-10-11 MED ORDER — LIDOCAINE HCL (PF) 1 % IJ SOLN
INTRAMUSCULAR | Status: DC | PRN
  Administered 2014-10-11: 4 mL
  Administered 2014-10-11: 44 mL

## 2014-10-11 MED ORDER — SENNOSIDES-DOCUSATE SODIUM 8.6-50 MG PO TABS
2.0000 | ORAL_TABLET | ORAL | Status: DC
  Administered 2014-10-11: 2 via ORAL
  Filled 2014-10-11: qty 2

## 2014-10-11 MED ORDER — OXYCODONE-ACETAMINOPHEN 5-325 MG PO TABS: 2.0000 | ORAL_TABLET | ORAL | Status: DC | PRN

## 2014-10-11 MED ORDER — ONDANSETRON HCL 4 MG PO TABS: 4.0000 mg | ORAL_TABLET | ORAL | Status: DC | PRN

## 2014-10-11 MED ORDER — FENTANYL 2.5 MCG/ML BUPIVACAINE 1/10 % EPIDURAL INFUSION (WH - ANES)
INTRAMUSCULAR | Status: AC
  Administered 2014-10-11: 14 mL/h via EPIDURAL
  Filled 2014-10-11: qty 125

## 2014-10-11 MED ORDER — EPHEDRINE 5 MG/ML INJ
10.0000 mg | INTRAVENOUS | Status: DC | PRN
  Filled 2014-10-11: qty 2

## 2014-10-11 MED ORDER — ZOLPIDEM TARTRATE 5 MG PO TABS: 5.0000 mg | ORAL_TABLET | Freq: Every evening | ORAL | Status: DC | PRN

## 2014-10-11 MED ORDER — TETANUS-DIPHTH-ACELL PERTUSSIS 5-2.5-18.5 LF-MCG/0.5 IM SUSP: 0.5000 mL | Freq: Once | INTRAMUSCULAR | Status: DC

## 2014-10-11 MED ORDER — FENTANYL CITRATE (PF) 100 MCG/2ML IJ SOLN
100.0000 ug | INTRAMUSCULAR | Status: AC
  Administered 2014-10-11: 100 ug via INTRAVENOUS

## 2014-10-11 MED ORDER — OXYCODONE-ACETAMINOPHEN 5-325 MG PO TABS: 1.0000 | ORAL_TABLET | ORAL | Status: DC | PRN

## 2014-10-11 MED ORDER — DIBUCAINE 1 % RE OINT: 1.0000 "application " | TOPICAL_OINTMENT | RECTAL | Status: DC | PRN

## 2014-10-11 MED ORDER — FENTANYL 2.5 MCG/ML BUPIVACAINE 1/10 % EPIDURAL INFUSION (WH - ANES)
14.0000 mL/h | INTRAMUSCULAR | Status: DC | PRN
  Administered 2014-10-11 (×3): 14 mL/h via EPIDURAL
  Filled 2014-10-11: qty 125

## 2014-10-11 MED ORDER — DIPHENHYDRAMINE HCL 25 MG PO CAPS: 25.0000 mg | ORAL_CAPSULE | Freq: Four times a day (QID) | ORAL | Status: DC | PRN

## 2014-10-11 MED ORDER — IBUPROFEN 600 MG PO TABS
600.0000 mg | ORAL_TABLET | Freq: Four times a day (QID) | ORAL | Status: DC
  Administered 2014-10-11 – 2014-10-12 (×3): 600 mg via ORAL
  Filled 2014-10-11 (×4): qty 1

## 2014-10-11 MED ORDER — DIPHENHYDRAMINE HCL 50 MG/ML IJ SOLN: 12.5000 mg | INTRAMUSCULAR | Status: DC | PRN

## 2014-10-11 MED ORDER — PHENYLEPHRINE 40 MCG/ML (10ML) SYRINGE FOR IV PUSH (FOR BLOOD PRESSURE SUPPORT)
80.0000 ug | PREFILLED_SYRINGE | INTRAVENOUS | Status: DC | PRN
  Filled 2014-10-11: qty 2

## 2014-10-11 MED ORDER — LANOLIN HYDROUS EX OINT: TOPICAL_OINTMENT | CUTANEOUS | Status: DC | PRN

## 2014-10-11 MED ORDER — SIMETHICONE 80 MG PO CHEW: 80.0000 mg | CHEWABLE_TABLET | ORAL | Status: DC | PRN

## 2014-10-11 MED ORDER — ACETAMINOPHEN 325 MG PO TABS: 650.0000 mg | ORAL_TABLET | ORAL | Status: DC | PRN

## 2014-10-11 MED ORDER — FENTANYL CITRATE (PF) 100 MCG/2ML IJ SOLN
INTRAMUSCULAR | Status: AC
  Filled 2014-10-11: qty 2

## 2014-10-11 MED ORDER — PHENYLEPHRINE 40 MCG/ML (10ML) SYRINGE FOR IV PUSH (FOR BLOOD PRESSURE SUPPORT)
PREFILLED_SYRINGE | INTRAVENOUS | Status: AC
  Filled 2014-10-11: qty 20

## 2014-10-11 MED ORDER — WITCH HAZEL-GLYCERIN EX PADS
1.0000 "application " | MEDICATED_PAD | CUTANEOUS | Status: DC | PRN
  Administered 2014-10-12: 1 via TOPICAL

## 2014-10-11 NOTE — Anesthesia Procedure Notes (Signed)
Epidural Patient location during procedure: OB  Staffing Anesthesiologist: Reganne Messerschmidt Performed by: anesthesiologist   Preanesthetic Checklist Completed: patient identified, site marked, surgical consent, pre-op evaluation, timeout performed, IV checked, risks and benefits discussed and monitors and equipment checked  Epidural Patient position: sitting Prep: site prepped and draped and DuraPrep Patient monitoring: continuous pulse ox and blood pressure Approach: midline Location: L3-L4 Injection technique: LOR air  Needle:  Needle type: Tuohy  Needle gauge: 17 G Needle length: 9 cm and 9 Needle insertion depth: 6 cm Catheter type: closed end flexible Catheter size: 19 Gauge Catheter at skin depth: 10 cm Test dose: negative  Assessment Events: blood not aspirated, injection not painful, no injection resistance, negative IV test and no paresthesia  Additional Notes Patient identified. Risks/Benefits/Options discussed with patient including but not limited to bleeding, infection, nerve damage, paralysis, failed block, incomplete pain control, headache, blood pressure changes, nausea, vomiting, reactions to medication both or allergic, itching and postpartum back pain. Confirmed with bedside nurse the patient's most recent platelet count. Confirmed with patient that they are not currently taking any anticoagulation, have any bleeding history or any family history of bleeding disorders. Patient expressed understanding and wished to proceed. All questions were answered. Sterile technique was used throughout the entire procedure. Please see nursing notes for vital signs. Test dose was given through epidural catheter and negative prior to continuing to dose epidural or start infusion. Warning signs of high block given to the patient including shortness of breath, tingling/numbness in hands, complete motor block, or any concerning symptoms with instructions to call for help. Patient was given  instructions on fall risk and not to get out of bed. All questions and concerns addressed with instructions to call with any issues or inadequate analgesia.

## 2014-10-11 NOTE — Progress Notes (Signed)
Brandy Burnett is a 29 y.o. G2P0010 at [redacted]w[redacted]d in labor Subjective:   Objective: BP 120/82 mmHg  Pulse 69  Temp(Src) 98.4 F (36.9 C) (Oral)  Resp 18  Ht  (1.6 m)  Wt 187 lb (84.823 kg)  BMI 33.13 kg/m2  LMP 01/10/2014 (LMP Unknown)      FHT:  FHR: 130 bpm, variability: moderate,  accelerations:  Present,  decelerations:  Absent UC:   regular, every 2-4 minutes SVE:   Dilation: 4 Effacement (%): 80 Station: -2 Exam by:: Dr. Doroteo Glassman  Labs: Lab Results  Component Value Date   WBC 10.3 10/10/2014   HGB 10.4* 10/10/2014   HCT 32.7* 10/10/2014   MCV 80.0 10/10/2014   PLT 239 10/10/2014    Assessment / Plan: Induction of labor for GDM A2  Labor: Progressing normally Preeclampsia:  n/a Fetal Wellbeing:  Category I Pain Control:  Epidural I/D:  n/a Anticipated MOD:  NSVD  Brandy Argyle H. 10/11/2014, 9:28 AM

## 2014-10-11 NOTE — Plan of Care (Signed)
Problem: Consults Goal: Diabetes Guidelines per MD order/protocol Outcome: Progressing Gestational diabetic- monitoring CBG's every 4 hours

## 2014-10-11 NOTE — Anesthesia Preprocedure Evaluation (Signed)
Anesthesia Evaluation  Patient identified by MRN, date of birth, ID band Patient awake    Reviewed: Allergy & Precautions, NPO status , Patient's Chart, lab work & pertinent test results  History of Anesthesia Complications Negative for: history of anesthetic complications  Airway Mallampati: II  TM Distance: >3 FB Neck ROM: Full    Dental no notable dental hx. (+) Dental Advisory Given   Pulmonary neg pulmonary ROS,  breath sounds clear to auscultation  Pulmonary exam normal       Cardiovascular negative cardio ROS Normal cardiovascular examRhythm:Regular Rate:Normal     Neuro/Psych negative neurological ROS  negative psych ROS   GI/Hepatic negative GI ROS, Neg liver ROS,   Endo/Other  diabetes  Renal/GU negative Renal ROS  negative genitourinary   Musculoskeletal negative musculoskeletal ROS (+)   Abdominal   Peds negative pediatric ROS (+)  Hematology negative hematology ROS (+)   Anesthesia Other Findings   Reproductive/Obstetrics (+) Pregnancy                             Anesthesia Physical Anesthesia Plan  ASA: II  Anesthesia Plan: Epidural   Post-op Pain Management:    Induction:   Airway Management Planned:   Additional Equipment:   Intra-op Plan:   Post-operative Plan: Extubation in OR  Informed Consent: I have reviewed the patients History and Physical, chart, labs and discussed the procedure including the risks, benefits and alternatives for the proposed anesthesia with the patient or authorized representative who has indicated his/her understanding and acceptance.   Dental advisory given  Plan Discussed with: CRNA  Anesthesia Plan Comments:         Anesthesia Quick Evaluation

## 2014-10-11 NOTE — Progress Notes (Signed)
Patient's husband has expressed concern after being educated about the process of laboring down.He is concerned of the length of time it will take for a female provider to become available. He has requested to talk to Dr. Despina Hidden.

## 2014-10-11 NOTE — Progress Notes (Signed)
Labor Progress Note  Brandy Burnett is a 29 y.o. G2P0010 at [redacted]w[redacted]d  admitted for induction of labor due to Gestational diabetes.  S: Doing well.   O:  BP 113/80 mmHg  Pulse 59  Temp(Src) 98.1 F (36.7 C) (Oral)  Resp 18  Ht  (1.6 m)  Wt 187 lb (84.823 kg)  BMI 33.13 kg/m2  LMP 01/10/2014 (LMP Unknown)  FHT:  FHR: 130 bpm, variability: moderate,  accelerations:  Present,  decelerations:  Absent UC:   regular, every 2-5 minutes SVE: Dilation: 3 Effacement (%): 60 Station: -2 Presentation: Vertex Exam by::  (Dr. Doroteo Glassman)  CBG (last 3)   Recent Labs  10/10/14 2011 10/11/14 0013 10/11/14 0428  GLUCAP 75 98 78    Assessment / Plan: 29 y.o. G2P0010 [redacted]w[redacted]d not in labor. Induction of labor due to gestational diabetes  Labor: IOL with cytotec, patient was unable to tolerate FB Fetal Wellbeing:  Category I Pain Control:  Labor support without medications Anticipated MOD:  NSVD  Expectant management   Caryl Ada, DO 10/11/2014, 7:05 AM PGY-2, [facility]

## 2014-10-12 LAB — GLUCOSE, CAPILLARY
GLUCOSE-CAPILLARY: 95 mg/dL (ref 65–99)
Glucose-Capillary: 101 mg/dL — ABNORMAL HIGH (ref 65–99)

## 2014-10-12 MED ORDER — IBUPROFEN 600 MG PO TABS: 600.0000 mg | ORAL_TABLET | Freq: Four times a day (QID) | ORAL | Status: AC

## 2014-10-12 MED ORDER — ACETAMINOPHEN 325 MG PO TABS: 650.0000 mg | ORAL_TABLET | ORAL | Status: AC | PRN

## 2014-10-12 NOTE — Discharge Summary (Signed)
Obstetric Discharge Summary  Reason for Admission: induction of labor Prenatal Procedures: NST and ultrasound Intrapartum Procedures: spontaneous vaginal delivery Postpartum Procedures: none Complications-Operative and Postpartum: none   Expand All Collapse All   Delivery Note At 8:29 PM a viable female was delivered via Vaginal, Spontaneous Delivery (Presentation: ; Occiput Anterior). APGAR: 8, 9; weight 7 lb 4.6 oz (3305 g). Infant dried and placed on pt's abd.  Placenta status: Intact, Spontaneous. Cord: 3 vessels   Anesthesia: Epidural  Episiotomy: None Lacerations: 2nd degree;Perineal Suture Repair: 3.0 vicryl Est. Blood Loss (mL): 400  Mom to postpartum. Baby to Couplet care / Skin to Skin.      Expand All Collapse All   Delivery Note At 8:29 PM a viable female was delivered via Vaginal, Spontaneous Delivery (Presentation: ; Occiput Anterior). APGAR: 8, 9; weight 7 lb 4.6 oz (3305 g). Infant dried and placed on pt's abd.  Placenta status: Intact, Spontaneous. Cord: 3 vessels   Anesthesia: Epidural  Episiotomy: None Lacerations: 2nd degree;Perineal Suture Repair: 3.0 vicryl Est. Blood Loss (mL): 400  Mom to postpartum. Baby to Couplet care / Skin to Skin.       Hospital Course:  Active Problems:   Gestational diabetes   Brandy Burnett is a 29 y.o. G2P1011 s/p induced SVD.  Patient was admitted for induction 2/2 A2GDM.  Cytotec and pitocin used for induction; she did not tolerate foley bulb placement. She has postpartum course that was uncomplicated including no problems with ambulating, PO intake, urination, pain, or bleeding. Fasting glucose ppd1 95, will need 2-hour GTT at 6-week pp visit. The pt feels ready to go home and  will be discharged with outpatient follow-up.   Today: No acute events overnight.  Pt denies problems with ambulating, voiding or po intake.  She denies nausea or vomiting.  Pain is well controlled.  She has had flatus. She has not  had bowel movement.  Lochia Small.  Plan for birth control is  none, counseled against the risks of short interval pregnancy.  Method of Feeding: breast  Physical Exam:  General: alert, cooperative and appears stated age 22: appropriate Uterine Fundus: firm Incision: n/a DVT Evaluation: No evidence of DVT seen on physical exam.  H/H: Lab Results  Component Value Date/Time   HGB 10.4* 10/10/2014 08:15 AM   HCT 32.7* 10/10/2014 08:15 AM    Discharge Diagnoses: Term Pregnancy-delivered  Discharge Information: Date: 10/12/2014 Activity: pelvic rest Diet: routine  Medications: PNV and Ibuprofen Breast feeding:  Yes Condition: stable Instructions: refer to handout Discharge to: home   Discharge Instructions    Call MD for:  difficulty breathing, headache or visual disturbances    Complete by:  As directed      Call MD for:  extreme fatigue    Complete by:  As directed      Call MD for:  hives    Complete by:  As directed      Call MD for:  persistant dizziness or light-headedness    Complete by:  As directed      Call MD for:  persistant nausea and vomiting    Complete by:  As directed      Call MD for:  severe uncontrolled pain    Complete by:  As directed      Call MD for:  temperature >100.4    Complete by:  As directed      Call MD for:    Complete by:  As directed  Diet general    Complete by:  As directed      Sexual acrtivity    Complete by:  As directed   Nothing in the vagina for at least 2 weeks            Medication List    STOP taking these medications        glyBURIDE 2.5 MG tablet  Commonly known as:  DIABETA      TAKE these medications        acetaminophen 325 MG tablet  Commonly known as:  TYLENOL  Take 2 tablets (650 mg total) by mouth every 4 (four) hours as needed (for pain scale < 4).     ibuprofen 600 MG tablet  Commonly known as:  ADVIL,MOTRIN  Take 1 tablet (600 mg total) by mouth every 6 (six) hours.      phenylephrine-shark liver oil-mineral oil-petrolatum 0.25-3-14-71.9 % rectal ointment  Commonly known as:  PREPARATION H  Place 1 application rectally 2 (two) times daily as needed for hemorrhoids.     polyethylene glycol packet  Commonly known as:  MIRALAX  Take 17 g by mouth 3 (three) times daily.     Prenatal Vitamins 0.8 MG tablet  Take 1 tablet by mouth daily.           Follow-up Information    Follow up with [facility] In 6 weeks.   Specialty:  Obstetrics and Gynecology   Why:  you will need a diabetes test at that time   Contact information:   [address] [place] [address] [phone]      Silvano Bilis ,MD OB Fellow 10/12/2014,9:02 AM

## 2014-10-12 NOTE — Lactation Note (Signed)
This note was copied from the chart of Brandy Janira Vetter. Lactation Consultation Note; Initial visit with mom. [employer] Arabic interpreter Mal Amabile present for visit to interpret for mom. Dad speaks good [place] but wanted interpreter for lactation visit. Mom reports some pain with latch to left breast. Assisted mom in football hold- encouraged waiting for wide open mouth and keeping the baby close to the breast throughout the feeding. Lots of basic teaching done. Mom asking about pump- no insurance or WIC. Manual pump given with instructions for use and cleaning. BF brochure given with resources for support after DC. No further questions at present To call prn  Patient Name: Brandy Burnett [id number] Date: 10/12/2014 Reason for consult: Initial assessment   Maternal Data Formula Feeding for Exclusion: No Has patient been taught Hand Expression?: Yes Does the patient have breastfeeding experience prior to this delivery?: No  Feeding Feeding Type: Breast Fed Length of feed: 10 min  LATCH Score/Interventions Latch: Grasps breast easily, tongue down, lips flanged, rhythmical sucking.  Audible Swallowing: A few with stimulation  Type of Nipple: Everted at rest and after stimulation  Comfort (Breast/Nipple): Filling, red/small blisters or bruises, mild/mod discomfort  Problem noted: Mild/Moderate discomfort Interventions (Mild/moderate discomfort): Hand expression  Hold (Positioning): Assistance needed to correctly position infant at breast and maintain latch.  LATCH Score: 7  Lactation Tools Discussed/Used WIC Program: No   Consult Status Consult Status: PRN    Pamelia Hoit 10/12/2014, 11:47 AM

## 2014-10-12 NOTE — Anesthesia Postprocedure Evaluation (Signed)
  Anesthesia Post-op Note  Patient: Brandy Burnett  Procedure(s) Performed: * No procedures listed *  Patient Location: Mother/Baby  Anesthesia Type:Epidural  Level of Consciousness: awake  Airway and Oxygen Therapy: Patient Spontanous Breathing  Post-op Pain: mild  Post-op Assessment: Patient's Cardiovascular Status Stable and Respiratory Function Stable              Post-op Vital Signs: stable  Last Vitals:  Filed Vitals:   10/12/14 0345  BP: 109/57  Pulse: 73  Temp: 36.8 C  Resp: 18    Complications: No apparent anesthesia complications

## 2014-10-12 NOTE — Progress Notes (Signed)
UR chart review completed.  

## 2014-10-13 NOTE — Progress Notes (Signed)
Pt discharged to home as ordered, all verbal and written discharge instructions given to pt and husband- both verbalize understanding. Pt discharged in stable condition and w/out incident- accompanied by spouse and infant.

## 2014-10-23 ENCOUNTER — Encounter (HOSPITAL_COMMUNITY): Payer: Self-pay | Admitting: Family

## 2014-10-23 ENCOUNTER — Inpatient Hospital Stay (HOSPITAL_COMMUNITY)
Admission: AD | Admit: 2014-10-23 | Discharge: 2014-10-23 | Disposition: A | Discharge status: Home / Self Care | Payer: Self-pay | Source: Ambulatory Visit | Attending: Obstetrics & Gynecology | Admitting: Obstetrics & Gynecology

## 2014-10-23 DIAGNOSIS — O9089 Other complications of the puerperium, not elsewhere classified: Secondary | ICD-10-CM | POA: Insufficient documentation

## 2014-10-23 DIAGNOSIS — K640 First degree hemorrhoids: Secondary | ICD-10-CM

## 2014-10-23 DIAGNOSIS — L292 Pruritus vulvae: Secondary | ICD-10-CM | POA: Insufficient documentation

## 2014-10-23 DIAGNOSIS — O872 Hemorrhoids in the puerperium: Secondary | ICD-10-CM | POA: Insufficient documentation

## 2014-10-23 MED ORDER — HYDROCORTISONE ACE-PRAMOXINE 1-1 % RE FOAM: 1.0000 | Freq: Two times a day (BID) | RECTAL | Status: AC

## 2014-10-23 MED ORDER — TERCONAZOLE 0.4 % VA CREA: 1.0000 | TOPICAL_CREAM | Freq: Every day | VAGINAL | Status: AC

## 2014-10-23 NOTE — MAU Provider Note (Signed)
History   [id number]   Chief Complaint  Patient presents with  . Hemorrhoids    HPI Brandy Burnett is a 29 y.o. female  [id number] here with report of hemorrhoids that have worsened in past few days.  NSVD on 10/11/14.  No report of blood with bowel movement.  Denies constipation.  Also reports vaginal itching on area of laceration and in the vagina.  +scant vaginal bleeding reported.  (Declines wet prep secondary to potential cost).    Patient's last menstrual period was 01/10/2014 (lmp unknown).  OB History  Gravida Para Term Preterm AB SAB TAB Ectopic Multiple Living  0 1 1 0 0 0 1    # Outcome Date GA Lbr Len/2nd Weight Sex Delivery Anes PTL Lv  2 Term 10/11/14 [redacted]w[redacted]d 11:51 / 04:08 3.305 kg (7 lb 4.6 oz) F Vag-Spont EPI  Y  1 SAB               Past Medical History  Diagnosis Date  . Gestational diabetes     glyburide    Family History  Problem Relation Age of Onset  . Cancer Father   . Hypertension Father   . Hypertension Mother     Social History   Social History  . Marital Status: Married    Spouse Name: N/A  . Number of Children: N/A  . Years of Education: N/A   Social History Main Topics  . Smoking status: Never Smoker   . Smokeless tobacco: Never Used  . Alcohol Use: No  . Drug Use: No  . Sexual Activity: Yes   Other Topics Concern  . None   Social History Narrative    No Known Allergies  No current facility-administered medications on file prior to encounter.   Current Outpatient Prescriptions on File Prior to Encounter  Medication Sig Dispense Refill  . acetaminophen (TYLENOL) 325 MG tablet Take 2 tablets (650 mg total) by mouth every 4 (four) hours as needed (for pain scale < 4). 90 tablet 3  . ibuprofen (ADVIL,MOTRIN) 600 MG tablet Take 1 tablet (600 mg total) by mouth every 6 (six) hours. 30 tablet 0  . phenylephrine-shark liver oil-mineral oil-petrolatum (PREPARATION H) 0.25-3-14-71.9 % rectal ointment Place 1 application rectally 2  (two) times daily as needed for hemorrhoids.    . polyethylene glycol (MIRALAX) packet Take 17 g by mouth 3 (three) times daily. (Patient taking differently: Take 17 g by mouth as needed for mild constipation. ) 14 each 0  . Prenatal Multivit-Min-Fe-FA (PRENATAL VITAMINS) 0.8 MG tablet Take 1 tablet by mouth daily. 30 tablet 12     Review of Systems  Constitutional: Negative for fever and chills.  Gastrointestinal: Positive for rectal pain. Negative for nausea, vomiting, diarrhea, constipation and blood in stool.  Genitourinary: Positive for vaginal bleeding (scant). Negative for dysuria and pelvic pain.  All other systems reviewed and are negative.    Physical Exam   Filed Vitals:   10/23/14 0657 10/23/14 0749  BP:  111/70  Pulse: 67 80  Temp: 98.1 F (36.7 C) 98 F (36.7 C)  TempSrc: Oral Oral  Resp: 16 18  Height: 5' 1.8" (1.57 m)   Weight: 74.753 kg (164 lb 12.8 oz)   SpO2: 100%     Physical Exam  Constitutional: She is oriented to person, place, and time. She appears well-developed and well-nourished.  HENT:  Head: Normocephalic.  Neck: Normal range of motion. Neck supple.  Cardiovascular: Normal rate, regular rhythm and normal  heart sounds.   Respiratory: Effort normal and breath sounds normal.  GI: Soft. There is no rebound and no guarding.  Genitourinary: Rectal exam shows external hemorrhoid and tenderness. Rectal exam shows no internal hemorrhoid.    There is bleeding (scant; dark red; no bright red or brisk bleed) in the vagina. Vaginal discharge: vaginal bleeding.  Area of laceration well approximated; healing well.  No abnormal discharge seen.    Musculoskeletal: Normal range of motion. She exhibits no edema.  Neurological: She is alert and oriented to person, place, and time. She has normal reflexes.  Skin: Skin is warm and dry.    MAU Course  Procedures  MDM   Assessment and Plan  Vaginal Itching Hemorrhoids  Plan: Discharge to home  Provided  reassurance RX Proctofoam RX Terazol cream Keep scheduled follow-up postpartum appt   Marlis Edelson, CNM 10/23/2014 7:50 AM

## 2014-10-23 NOTE — Discharge Instructions (Signed)
Hemorrhoids Hemorrhoids are puffy (swollen) veins around the rectum or anus. Hemorrhoids can cause pain, itching, bleeding, or irritation. HOME CARE  Eat foods with fiber, such as whole grains, beans, nuts, fruits, and vegetables. Ask your doctor about taking products with added fiber in them (fibersupplements).  Drink enough fluid to keep your pee (urine) clear or pale yellow.  Exercise often.  Go to the bathroom when you have the urge to poop. Do not wait.  Avoid straining to poop (bowel movement).  Keep the butt area dry and clean. Use wet toilet paper or moist paper towels.  Medicated creams and medicine inserted into the anus (anal suppository) may be used or applied as told.  Only take medicine as told by your doctor.  Take a warm water bath (sitz bath) for 15-20 minutes to ease pain. Do this 3-4 times a day.  Place ice packs on the area if it is tender or puffy. Use the ice packs between the warm water baths.  Put ice in a plastic bag.  Place a towel between your skin and the bag.  Leave the ice on for 15-20 minutes, 03-04 times a day.  Do not use a donut-shaped pillow or sit on the toilet for a long time. GET HELP RIGHT AWAY IF:   You have more pain that is not controlled by treatment or medicine.  You have bleeding that will not stop.  You have trouble or are unable to poop (bowel movement).  You have pain or puffiness outside the area of the hemorrhoids. MAKE SURE YOU:   Understand these instructions.  Will watch your condition.  Will get help right away if you are not doing well or get worse. Document Released: 11/06/2007 Document Revised: 01/14/2012 Document Reviewed: 12/09/2011 [employer] Patient Information 2015 [employer]. This information is not intended to replace advice given to you by your health care provider. Make sure you discuss any questions you have with your health care provider.  

## 2014-10-23 NOTE — MAU Note (Signed)
Pt had a SVD on 10/11/2014. Had hemorrhoid and was told that it should get better, but the pain has gotten worse. No problems with BM. Does not need stool softner. Also having some pain and itching in vaginal region. Bleeding not heavy and no abnormal discharge.

## 2014-10-26 ENCOUNTER — Ambulatory Visit: Payer: Self-pay | Admitting: Obstetrics & Gynecology

## 2014-11-02 ENCOUNTER — Ambulatory Visit: Payer: Self-pay | Admitting: Obstetrics & Gynecology

## 2014-11-13 ENCOUNTER — Ambulatory Visit: Payer: Self-pay | Admitting: Medical
